# Patient Record
Sex: Female | Born: 1995 | Race: White | Hispanic: No | Marital: Single | Smoking: Current every day smoker
Health system: Southern US, Community
[De-identification: ages and names within clinical notes are randomized; demographics above are authoritative.]

## PROBLEM LIST (undated history)

## (undated) DIAGNOSIS — N200 Calculus of kidney: Secondary | ICD-10-CM

## (undated) DIAGNOSIS — F329 Major depressive disorder, single episode, unspecified: Secondary | ICD-10-CM

## (undated) DIAGNOSIS — E039 Hypothyroidism, unspecified: Principal | ICD-10-CM

## (undated) DIAGNOSIS — U071 COVID-19: Secondary | ICD-10-CM

## (undated) DIAGNOSIS — E559 Vitamin D deficiency, unspecified: Secondary | ICD-10-CM

## (undated) DIAGNOSIS — F32A Depression, unspecified: Secondary | ICD-10-CM

## (undated) HISTORY — DX: Hypothyroidism, unspecified: E03.9

## (undated) HISTORY — DX: Vitamin D deficiency, unspecified: E55.9

## (undated) HISTORY — DX: Calculus of kidney: N20.0

## (undated) HISTORY — DX: COVID-19: U07.1

## (undated) HISTORY — PX: WISDOM TOOTH EXTRACTION: SHX21

---
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
## 2015-01-18 ENCOUNTER — Other Ambulatory Visit: Payer: Self-pay | Admitting: Internal Medicine

## 2015-03-02 ENCOUNTER — Encounter: Payer: Self-pay | Admitting: Internal Medicine

## 2015-03-02 ENCOUNTER — Ambulatory Visit (INDEPENDENT_AMBULATORY_CARE_PROVIDER_SITE_OTHER): Payer: 59 | Admitting: Internal Medicine

## 2015-03-02 VITALS — BP 106/66 | HR 73 | Temp 98.9°F | Ht 61.5 in | Wt 117.2 lb

## 2015-03-02 DIAGNOSIS — Z0000 Encounter for general adult medical examination without abnormal findings: Secondary | ICD-10-CM | POA: Diagnosis not present

## 2015-03-02 DIAGNOSIS — Z23 Encounter for immunization: Secondary | ICD-10-CM

## 2015-03-02 DIAGNOSIS — E039 Hypothyroidism, unspecified: Secondary | ICD-10-CM | POA: Diagnosis not present

## 2015-03-02 MED ORDER — LEVONORGESTREL-ETHINYL ESTRAD 0.1-20 MG-MCG PO TABS: 1.0000 | ORAL_TABLET | Freq: Every day | ORAL | Status: DC

## 2015-03-02 NOTE — Progress Notes (Signed)
Pre visit review using our clinic review tool, if applicable. No additional management support is needed unless otherwise documented below in the visit note. 

## 2015-03-02 NOTE — Progress Notes (Signed)
Subjective:    Patient ID: Corlis Leak, female    DOB: [birth year], 19 y.o.   MRN: [id number]  HPI  19YO female presents for annual exam.  Feeling well. Spotting some in between menses. No missed doses of OCP. Taking 3 months active pill. Now sexually active. No vaginal pain, discharge, pelvic pain. Feels safe where she lives. Completing high school and plans to start community college.    Wt Readings from Last 3 Encounters:  03/02/15 117 lb 4 oz (53.184 kg) (31 %*, Z = -0.48)  11/22/14 107 lb (48.535 kg) (13 %*, Z = -1.14)  11/21/14 107 lb 6.4 oz (48.716 kg) (13 %*, Z = -1.11)   * Growth percentiles are based on CDC 2-20 Years data.   BP Readings from Last 3 Encounters:  03/02/15 106/66  11/22/14 119/80  11/21/14 96/68    Past Medical History  Diagnosis Date  . Hypothyroidism 2011  . Depression   . Hypothyroidism   . Kidney stone    Family History  Problem Relation Age of Onset  . Hypertension Maternal Grandmother   . Thyroid disease Maternal Grandmother   . Depression Maternal Grandmother   . Hypertension Maternal Grandfather   . Cancer Maternal Grandfather     lung  . Stroke Other   . Diabetes Other   . Thyroid disease Paternal Grandmother   . Cancer Paternal Grandfather     prostate  . Depression Mother   . Depression Maternal Aunt    Past Surgical History  Procedure Laterality Date  . Wisdom tooth extraction     Social History   Social History  . Marital Status: Single    Spouse Name: N/A  . Number of Children: N/A  . Years of Education: N/A   Social History Main Topics  . Smoking status: Never Smoker   . Smokeless tobacco: Never Used  . Alcohol Use: No  . Drug Use: No  . Sexual Activity: Yes    Birth Control/ Protection: Pill   Other Topics Concern  . None   Social History Narrative   ** Merged History Encounter **       Lives in [place]. School - [employer]. [employer].      Exercise - active   Diet -healthy     Review of Systems  Constitutional: Negative for fever, chills, appetite change, fatigue and unexpected weight change.  Eyes: Negative for visual disturbance.  Respiratory: Negative for shortness of breath.   Cardiovascular: Negative for chest pain and leg swelling.  Gastrointestinal: Negative for nausea, vomiting, abdominal pain, diarrhea and constipation.  Genitourinary: Negative for dysuria, frequency, flank pain, vaginal bleeding, vaginal discharge, vaginal pain, menstrual problem, pelvic pain and dyspareunia.  Musculoskeletal: Negative for myalgias and arthralgias.  Skin: Negative for color change and rash.  Neurological: Negative for weakness.  Hematological: Negative for adenopathy. Does not bruise/bleed easily.  Psychiatric/Behavioral: Negative for suicidal ideas, sleep disturbance and dysphoric mood. The patient is not nervous/anxious.        Objective:    BP 106/66 mmHg  Pulse 73  Temp(Src) 98.9 F (37.2 C) (Oral)  Ht 5' 1.5" (1.562 m)  Wt 117 lb 4 oz (53.184 kg)  BMI 21.80 kg/m2  SpO2 98%  LMP 12/22/2014 (Approximate) Physical Exam  Constitutional: She is oriented to person, place, and time. She appears well-developed and well-nourished. No distress.  HENT:  Head: Normocephalic and atraumatic.  Right Ear: External ear normal.  Left Ear: External ear normal.  Nose: Nose normal.  Mouth/Throat: Oropharynx is clear and moist. No oropharyngeal exudate.  Eyes: Conjunctivae and EOM are normal. Pupils are equal, round, and reactive to light. Right eye exhibits no discharge. Left eye exhibits no discharge. No scleral icterus.  Neck: Normal range of motion. Neck supple. No tracheal deviation present. No thyromegaly present.  Cardiovascular: Normal rate, regular rhythm, normal heart sounds and intact distal pulses.  Exam reveals no gallop and no friction rub.   No murmur heard. Pulmonary/Chest: Effort normal and breath sounds normal. No respiratory distress. She has no  wheezes. She has no rales. She exhibits no tenderness.  Abdominal: Soft. Bowel sounds are normal. She exhibits no distension and no mass. There is no tenderness. There is no rebound and no guarding.  Musculoskeletal: Normal range of motion. She exhibits no edema or tenderness.  Lymphadenopathy:    She has no cervical adenopathy.  Neurological: She is alert and oriented to person, place, and time. No cranial nerve deficit. She exhibits normal muscle tone. Coordination normal.  Skin: Skin is warm and dry. No rash noted. She is not diaphoretic. No erythema. No pallor.  Psychiatric: She has a normal mood and affect. Her behavior is normal. Judgment and thought content normal.          Assessment & Plan:   Problem List Items Addressed This Visit      Unprioritized   Hypothyroidism    Will check TSH with labs. Continue Levothyroxine.      Relevant Orders   TSH   Routine general medical examination at a health care facility - Primary    General medical exam normal today. Will check labs including urine chlamydia, HIV, CBC, CMP, lipids, TSH. Flu vaccine today. Encouraged healthy diet and exercise. Will change back to monthly OCP, as some spotting with [id number] OCP. Discussed safe sex practices. Encouraged healthy diet and exercises. Follow up in 1 year and prn.      Relevant Orders   GC/chlamydia probe amp, urine   CBC with Differential/Platelet   Lipid panel   Comprehensive metabolic panel   HIV antibody       Return in about 1 year (around 03/01/2016) for Physical.

## 2015-03-02 NOTE — Patient Instructions (Signed)
Health Maintenance, Female Adopting a healthy lifestyle and getting preventive care can go a long way to promote health and wellness. Talk with your health care provider about what schedule of regular examinations is right for you. This is a good chance for you to check in with your provider about disease prevention and staying healthy. In between checkups, there are plenty of things you can do on your own. Experts have done a lot of research about which lifestyle changes and preventive measures are most likely to keep you healthy. Ask your health care provider for more information. WEIGHT AND DIET  Eat a healthy diet  Be sure to include plenty of vegetables, fruits, low-fat dairy products, and lean protein.  Do not eat a lot of foods high in solid fats, added sugars, or salt.  Get regular exercise. This is one of the most important things you can do for your health.  Most adults should exercise for at least 150 minutes each week. The exercise should increase your heart rate and make you sweat (moderate-intensity exercise).  Most adults should also do strengthening exercises at least twice a week. This is in addition to the moderate-intensity exercise.  Maintain a healthy weight  Body mass index (BMI) is a measurement that can be used to identify possible weight problems. It estimates body fat based on height and weight. Your health care provider can help determine your BMI and help you achieve or maintain a healthy weight.  For females 20 years of age and older:   A BMI below 18.5 is considered underweight.  A BMI of 18.5 to 24.9 is normal.  A BMI of 25 to 29.9 is considered overweight.  A BMI of 30 and above is considered obese.  Watch levels of cholesterol and blood lipids  You should start having your blood tested for lipids and cholesterol at 20 years of age, then have this test every 5 years.  You may need to have your cholesterol levels checked more often if:  Your lipid  or cholesterol levels are high.  You are older than 19 years of age.  You are at high risk for heart disease.  CANCER SCREENING   Lung Cancer  Lung cancer screening is recommended for adults 55-80 years old who are at high risk for lung cancer because of a history of smoking.  A yearly low-dose CT scan of the lungs is recommended for people who:  Currently smoke.  Have quit within the past 15 years.  Have at least a 30-pack-year history of smoking. A pack year is smoking an average of one pack of cigarettes a day for 1 year.  Yearly screening should continue until it has been 15 years since you quit.  Yearly screening should stop if you develop a health problem that would prevent you from having lung cancer treatment.  Breast Cancer  Practice breast self-awareness. This means understanding how your breasts normally appear and feel.  It also means doing regular breast self-exams. Let your health care provider know about any changes, no matter how small.  If you are in your 20s or 30s, you should have a clinical breast exam (CBE) by a health care provider every 1-3 years as part of a regular health exam.  If you are 40 or older, have a CBE every year. Also consider having a breast X-ray (mammogram) every year.  If you have a family history of breast cancer, talk to your health care provider about genetic screening.  If you   are at high risk for breast cancer, talk to your health care provider about having an MRI and a mammogram every year.  Breast cancer gene (BRCA) assessment is recommended for women who have family members with BRCA-related cancers. BRCA-related cancers include:  Breast.  Ovarian.  Tubal.  Peritoneal cancers.  Results of the assessment will determine the need for genetic counseling and BRCA1 and BRCA2 testing. Cervical Cancer Your health care provider may recommend that you be screened regularly for cancer of the pelvic organs (ovaries, uterus, and  vagina). This screening involves a pelvic examination, including checking for microscopic changes to the surface of your cervix (Pap test). You may be encouraged to have this screening done every 3 years, beginning at age 21.  For women ages 30-65, health care providers may recommend pelvic exams and Pap testing every 3 years, or they may recommend the Pap and pelvic exam, combined with testing for human papilloma virus (HPV), every 5 years. Some types of HPV increase your risk of cervical cancer. Testing for HPV may also be done on women of any age with unclear Pap test results.  Other health care providers may not recommend any screening for nonpregnant women who are considered low risk for pelvic cancer and who do not have symptoms. Ask your health care provider if a screening pelvic exam is right for you.  If you have had past treatment for cervical cancer or a condition that could lead to cancer, you need Pap tests and screening for cancer for at least 20 years after your treatment. If Pap tests have been discontinued, your risk factors (such as having a new sexual partner) need to be reassessed to determine if screening should resume. Some women have medical problems that increase the chance of getting cervical cancer. In these cases, your health care provider may recommend more frequent screening and Pap tests. Colorectal Cancer  This type of cancer can be detected and often prevented.  Routine colorectal cancer screening usually begins at 19 years of age and continues through 19 years of age.  Your health care provider may recommend screening at an earlier age if you have risk factors for colon cancer.  Your health care provider may also recommend using home test kits to check for hidden blood in the stool.  A small camera at the end of a tube can be used to examine your colon directly (sigmoidoscopy or colonoscopy). This is done to check for the earliest forms of colorectal  cancer.  Routine screening usually begins at age 50.  Direct examination of the colon should be repeated every 5-10 years through 19 years of age. However, you may need to be screened more often if early forms of precancerous polyps or small growths are found. Skin Cancer  Check your skin from head to toe regularly.  Tell your health care provider about any new moles or changes in moles, especially if there is a change in a mole's shape or color.  Also tell your health care provider if you have a mole that is larger than the size of a pencil eraser.  Always use sunscreen. Apply sunscreen liberally and repeatedly throughout the day.  Protect yourself by wearing long sleeves, pants, a wide-brimmed hat, and sunglasses whenever you are outside. HEART DISEASE, DIABETES, AND HIGH BLOOD PRESSURE   High blood pressure causes heart disease and increases the risk of stroke. High blood pressure is more likely to develop in:  People who have blood pressure in the high end   of the normal range (130-139/85-89 mm Hg).  People who are overweight or obese.  People who are African American.  If you are 38-23 years of age, have your blood pressure checked every 3-5 years. If you are 61 years of age or older, have your blood pressure checked every year. You should have your blood pressure measured twice--once when you are at a hospital or clinic, and once when you are not at a hospital or clinic. Record the average of the two measurements. To check your blood pressure when you are not at a hospital or clinic, you can use:  An automated blood pressure machine at a pharmacy.  A home blood pressure monitor.  If you are between 45 years and 39 years old, ask your health care provider if you should take aspirin to prevent strokes.  Have regular diabetes screenings. This involves taking a blood sample to check your fasting blood sugar level.  If you are at a normal weight and have a low risk for diabetes,  have this test once every three years after 19 years of age.  If you are overweight and have a high risk for diabetes, consider being tested at a younger age or more often. PREVENTING INFECTION  Hepatitis B  If you have a higher risk for hepatitis B, you should be screened for this virus. You are considered at high risk for hepatitis B if:  You were born in a country where hepatitis B is common. Ask your health care provider which countries are considered high risk.  Your parents were born in a high-risk country, and you have not been immunized against hepatitis B (hepatitis B vaccine).  You have HIV or AIDS.  You use needles to inject street drugs.  You live with someone who has hepatitis B.  You have had sex with someone who has hepatitis B.  You get hemodialysis treatment.  You take certain medicines for conditions, including cancer, organ transplantation, and autoimmune conditions. Hepatitis C  Blood testing is recommended for:  Everyone born from [birth year] through 1965.  Anyone with known risk factors for hepatitis C. Sexually transmitted infections (STIs)  You should be screened for sexually transmitted infections (STIs) including gonorrhea and chlamydia if:  You are sexually active and are younger than 19 years of age.  You are older than 19 years of age and your health care provider tells you that you are at risk for this type of infection.  Your sexual activity has changed since you were last screened and you are at an increased risk for chlamydia or gonorrhea. Ask your health care provider if you are at risk.  If you do not have HIV, but are at risk, it may be recommended that you take a prescription medicine daily to prevent HIV infection. This is called pre-exposure prophylaxis (PrEP). You are considered at risk if:  You are sexually active and do not regularly use condoms or know the HIV status of your partner(s).  You take drugs by injection.  You are sexually  active with a partner who has HIV. Talk with your health care provider about whether you are at high risk of being infected with HIV. If you choose to begin PrEP, you should first be tested for HIV. You should then be tested every 3 months for as long as you are taking PrEP.  PREGNANCY   If you are premenopausal and you may become pregnant, ask your health care provider about preconception counseling.  If you may  become pregnant, take 400 to 800 micrograms (mcg) of folic acid every day.  If you want to prevent pregnancy, talk to your health care provider about birth control (contraception). OSTEOPOROSIS AND MENOPAUSE   Osteoporosis is a disease in which the bones lose minerals and strength with aging. This can result in serious bone fractures. Your risk for osteoporosis can be identified using a bone density scan.  If you are 61 years of age or older, or if you are at risk for osteoporosis and fractures, ask your health care provider if you should be screened.  Ask your health care provider whether you should take a calcium or vitamin D supplement to lower your risk for osteoporosis.  Menopause may have certain physical symptoms and risks.  Hormone replacement therapy may reduce some of these symptoms and risks. Talk to your health care provider about whether hormone replacement therapy is right for you.  HOME CARE INSTRUCTIONS   Schedule regular health, dental, and eye exams.  Stay current with your immunizations.   Do not use any tobacco products including cigarettes, chewing tobacco, or electronic cigarettes.  If you are pregnant, do not drink alcohol.  If you are breastfeeding, limit how much and how often you drink alcohol.  Limit alcohol intake to no more than 1 drink per day for nonpregnant women. One drink equals 12 ounces of beer, 5 ounces of wine, or 1 ounces of hard liquor.  Do not use street drugs.  Do not share needles.  Ask your health care provider for help if  you need support or information about quitting drugs.  Tell your health care provider if you often feel depressed.  Tell your health care provider if you have ever been abused or do not feel safe at home.   This information is not intended to replace advice given to you by your health care provider. Make sure you discuss any questions you have with your health care provider.   Document Released: 10/25/2010 Document Revised: 05/02/2014 Document Reviewed: 03/13/2013 Elsevier Interactive Patient Education [employer].

## 2015-03-02 NOTE — Assessment & Plan Note (Signed)
General medical exam normal today. Will check labs including urine chlamydia, HIV, CBC, CMP, lipids, TSH. Flu vaccine today. Encouraged healthy diet and exercise. Will change back to monthly OCP, as some spotting with [id number] OCP. Discussed safe sex practices. Encouraged healthy diet and exercises. Follow up in 1 year and prn.

## 2015-03-02 NOTE — Assessment & Plan Note (Signed)
Will check TSH with labs. Continue Levothyroxine. 

## 2015-03-03 ENCOUNTER — Telehealth: Payer: Self-pay | Admitting: Internal Medicine

## 2015-03-03 ENCOUNTER — Other Ambulatory Visit: Payer: Self-pay | Admitting: *Deleted

## 2015-03-03 LAB — LIPID PANEL
CHOL/HDL RATIO: 3
Cholesterol: 163 mg/dL (ref 0–200)
HDL: 49.7 mg/dL (ref >39.00)
LDL CALC: 93 mg/dL (ref 0–99)
NonHDL: 113.25
TRIGLYCERIDES: 102 mg/dL (ref 0.0–149.0)
VLDL: 20.4 mg/dL (ref 0.0–40.0)

## 2015-03-03 LAB — COMPREHENSIVE METABOLIC PANEL
ALBUMIN: 4.2 g/dL (ref 3.5–5.2)
ALT: 22 U/L (ref 0–35)
AST: 23 U/L (ref 0–37)
Alkaline Phosphatase: 50 U/L (ref 47–119)
BUN: 11 mg/dL (ref 6–23)
CHLORIDE: 104 meq/L (ref 96–112)
CO2: 24 meq/L (ref 19–32)
Calcium: 9.3 mg/dL (ref 8.4–10.5)
Creatinine, Ser: 0.56 mg/dL (ref 0.40–1.20)
GFR: 148.29 mL/min (ref >60.00)
Glucose, Bld: 36 mg/dL — CL (ref 70–99)
POTASSIUM: 4.1 meq/L (ref 3.5–5.1)
Sodium: 140 mEq/L (ref 135–145)
Total Bilirubin: 0.6 mg/dL (ref 0.3–1.2)
Total Protein: 6.7 g/dL (ref 6.0–8.3)

## 2015-03-03 LAB — CBC WITH DIFFERENTIAL/PLATELET
BASOS PCT: 0.6 % (ref 0.0–3.0)
Basophils Absolute: 0 10*3/uL (ref 0.0–0.1)
EOS ABS: 0.1 10*3/uL (ref 0.0–0.7)
Eosinophils Relative: 1.6 % (ref 0.0–5.0)
HEMATOCRIT: 39.3 % (ref 36.0–49.0)
HEMOGLOBIN: 13.2 g/dL (ref 12.0–16.0)
Lymphocytes Relative: 23.1 % — ABNORMAL LOW (ref 24.0–48.0)
Lymphs Abs: 1.9 10*3/uL (ref 0.7–4.0)
MCHC: 33.7 g/dL (ref 31.0–37.0)
MCV: 92.3 fl (ref 78.0–98.0)
Monocytes Absolute: 0.5 10*3/uL (ref 0.1–1.0)
Monocytes Relative: 6.5 % (ref 3.0–12.0)
Neutro Abs: 5.6 10*3/uL (ref 1.4–7.7)
Neutrophils Relative %: 68.2 % (ref 43.0–71.0)
Platelets: 289 10*3/uL (ref 150.0–575.0)
RBC: 4.26 Mil/uL (ref 3.80–5.70)
RDW: 13.4 % (ref 11.4–15.5)
WBC: 8.2 10*3/uL (ref 4.5–13.5)

## 2015-03-03 LAB — TSH: TSH: 6 u[IU]/mL — ABNORMAL HIGH (ref 0.40–5.00)

## 2015-03-03 LAB — HIV ANTIBODY (ROUTINE TESTING W REFLEX): HIV 1&2 Ab, 4th Generation: NONREACTIVE

## 2015-03-03 LAB — GC/CHLAMYDIA PROBE AMP, URINE
Chlamydia, Swab/Urine, PCR: NEGATIVE
GC PROBE AMP, URINE: NEGATIVE

## 2015-03-03 MED ORDER — LEVOTHYROXINE SODIUM 112 MCG PO TABS: 112.0000 ug | ORAL_TABLET | Freq: Every day | ORAL | Status: DC

## 2015-03-03 NOTE — Telephone Encounter (Signed)
This lab was from yesterday. Glucose likely low because blood sat for so long before processing.

## 2015-03-03 NOTE — Telephone Encounter (Signed)
CRITICAL LAB:  GLUCOSE: 36  Per the [facility].   Please advise?

## 2015-04-14 ENCOUNTER — Other Ambulatory Visit: Payer: Self-pay

## 2015-05-11 ENCOUNTER — Other Ambulatory Visit: Payer: Self-pay | Admitting: Internal Medicine

## 2015-05-25 ENCOUNTER — Other Ambulatory Visit: Payer: Self-pay

## 2015-05-25 MED ORDER — LEVOTHYROXINE SODIUM 112 MCG PO TABS: 112.0000 ug | ORAL_TABLET | Freq: Every day | ORAL | Status: DC

## 2015-05-28 ENCOUNTER — Other Ambulatory Visit: Payer: Self-pay | Admitting: Internal Medicine

## 2015-07-07 ENCOUNTER — Other Ambulatory Visit: Payer: Self-pay | Admitting: *Deleted

## 2015-07-07 MED ORDER — LEVOTHYROXINE SODIUM 112 MCG PO TABS: 112.0000 ug | ORAL_TABLET | Freq: Every day | ORAL | Status: DC

## 2015-07-07 NOTE — Telephone Encounter (Signed)
Fine to fill x 1 month. Needs repeat TSH prior to additional refills

## 2015-07-07 NOTE — Telephone Encounter (Signed)
Patient requested a medication refill for levothyroxine . Pharmacy [facility] in [place]

## 2015-07-07 NOTE — Telephone Encounter (Signed)
FYI: Pt scheduled for labs 07/30/15

## 2015-07-07 NOTE — Telephone Encounter (Signed)
Pt is requesting a refill on levothyroxine (SYNTHROID, LEVOTHROID) 112 MCG tablet. Pt last OV was 11/16. Pt did not come in for 6 week labs for tsh. Please advise, thanks

## 2015-07-30 ENCOUNTER — Other Ambulatory Visit (INDEPENDENT_AMBULATORY_CARE_PROVIDER_SITE_OTHER): Payer: 59

## 2015-07-30 ENCOUNTER — Telehealth: Payer: Self-pay | Admitting: *Deleted

## 2015-07-30 DIAGNOSIS — E038 Other specified hypothyroidism: Secondary | ICD-10-CM | POA: Diagnosis not present

## 2015-07-30 NOTE — Telephone Encounter (Signed)
Labs and dx?  

## 2015-07-30 NOTE — Telephone Encounter (Signed)
I assume this was for a TSH repeat? For hypothyroidism

## 2015-07-31 LAB — TSH: TSH: 4.02 u[IU]/mL (ref 0.40–5.00)

## 2015-12-23 IMAGING — CT CT ABD-PELV W/O CM
1 of 2 series · 5 of 32 positions shown, 10 images · non-contrast
Comparison: None.

CLINICAL DATA: Left flank pain, history of kidney stones, some red
blood cells in the urine

EXAM:
CT ABDOMEN AND PELVIS WITHOUT CONTRAST
TECHNIQUE: Multidetector CT imaging of the abdomen and pelvis was performed
following the standard protocol without IV contrast.

[Series 4: lung windows · axial · 0.59mm/px · z∈[-644,-578]mm · 5 of 21 slices shown, 10 images]
[im 4/21  soft-tissue]
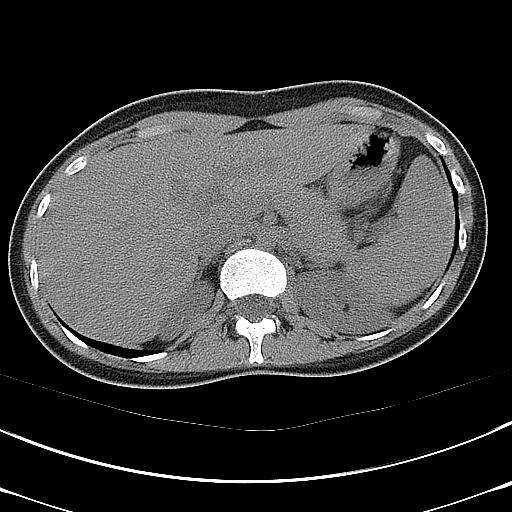
[im 4/21  bone]
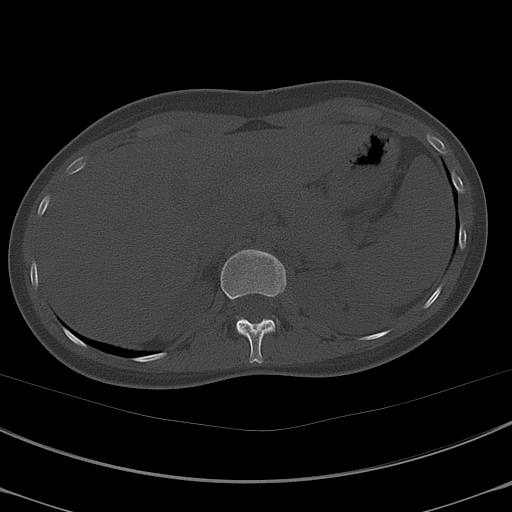
[im 7/21  soft-tissue]
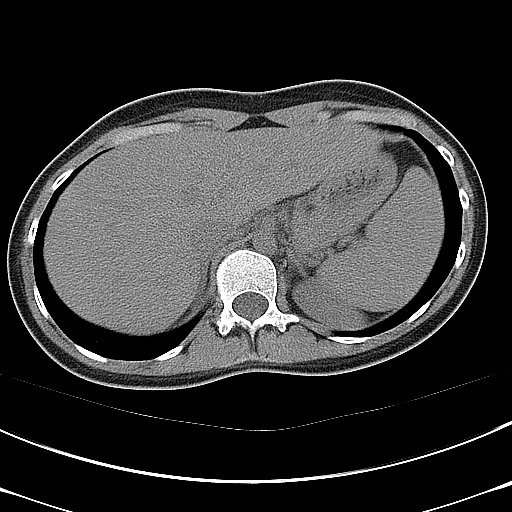
[im 7/21  lung]
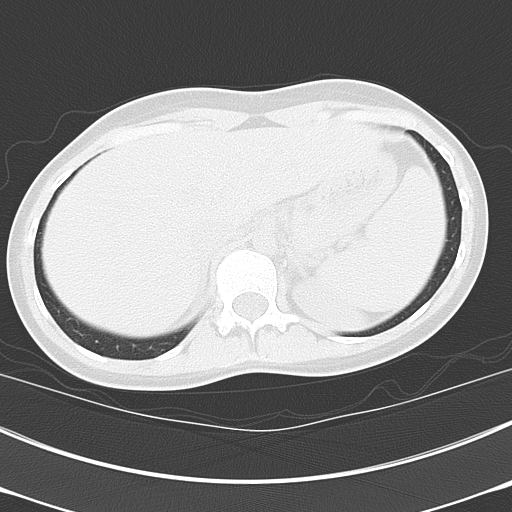
[im 11/21  soft-tissue]
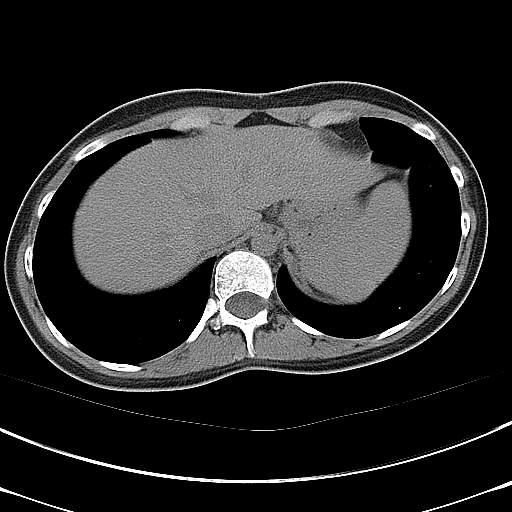
[im 11/21  lung]
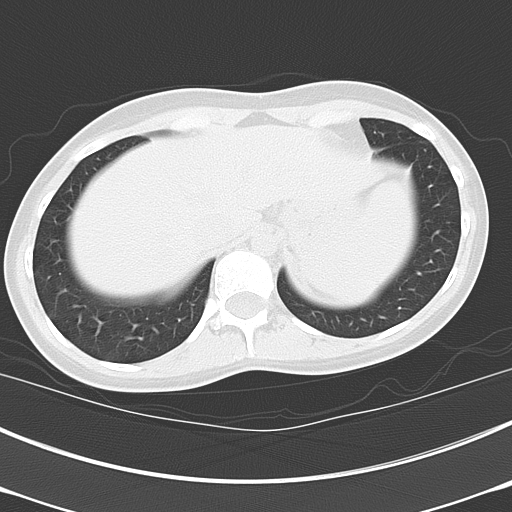
[im 14/21  soft-tissue]
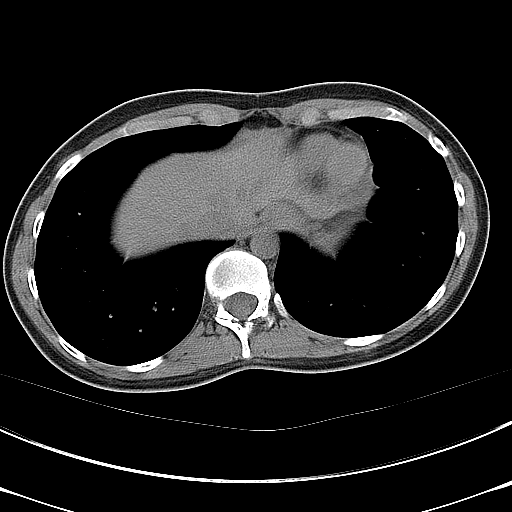
[im 14/21  lung]
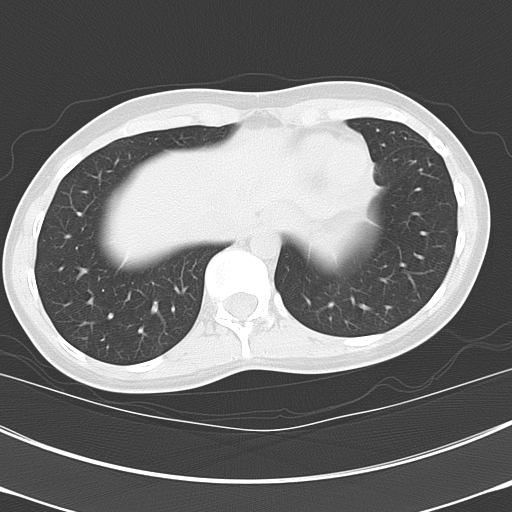
[im 17/21  soft-tissue]
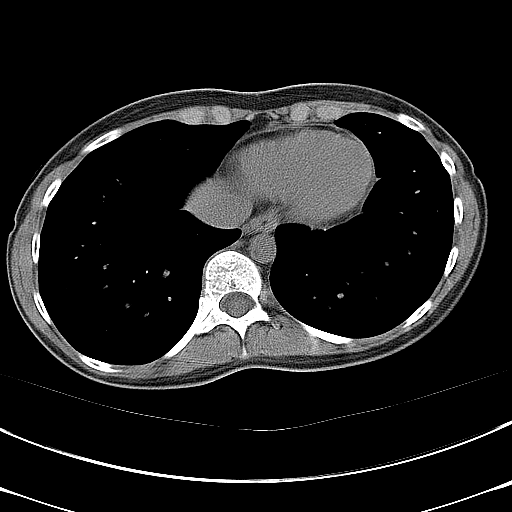
[im 17/21  lung]
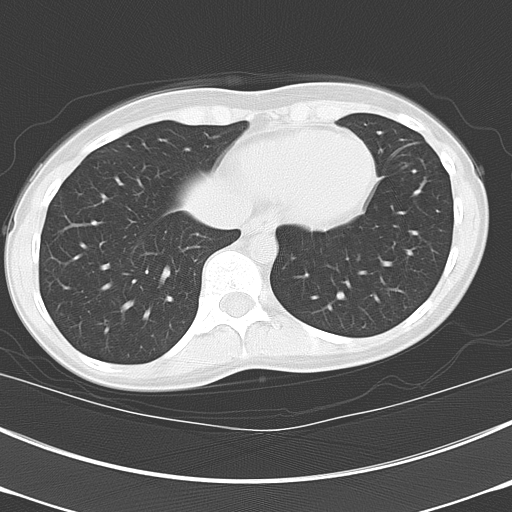

[5 of 32 positions shown; findings below may reference images not displayed]

FINDINGS: The lung bases are clear. The liver is unremarkable in the
unenhanced state. No calcified gallstones are seen. The pancreas is
difficult to visualize on this unenhanced study, and some prominence
of the tail of the pancreas cannot be excluded. Ultrasound may be
helpful if any further assessment is warranted. This appearance of
the tail of the pancreas may simply be due to adjacent non-opacified
bowel loops. There are bilateral renal calculi present. The largest
calculus is within the left lower collecting system measuring 6 x 8
mm. Smaller calculi are present bilaterally. The proximal ureters do
not appear to be dilated although there is slight fullness of the
left pelvis of questionable significance, possibly due to mild UPJ
narrowing. The abdominal aorta is normal in caliber.

The distal ureters are difficult to visualize on this unenhanced
study but no dilatation or calculus is seen. There are
low-attenuation structures in both adnexa consistent with small
follicles and the uterus is normal in size. There is a small amount
of free fluid noted within the pelvis which appears physiologic. The
colon is largely decompressed. The lumbar vertebrae are in normal
alignment.
IMPRESSION: 1. Bilateral nonobstructing renal calculi. No present
hydronephrosis. No ureteral calculi are noted.
2. Slight prominence of the tail of the pancreas most likely due to
adjacent non-opacified bowel loops in this young patient. Ultrasound
may be helpful to assess further if warranted clinically.
3. Small amount of fluid in the pelvis most likely physiologic with
probable follicles bilaterally.
4. Slight prominence of the left renal pelvis probably indicates
mild UPJ narrowing.

## 2016-01-22 ENCOUNTER — Other Ambulatory Visit: Payer: Self-pay

## 2016-01-22 MED ORDER — LEVONORGESTREL-ETHINYL ESTRAD 0.1-20 MG-MCG PO TABS: 1.0000 | ORAL_TABLET | Freq: Every day | ORAL | 1 refills | Status: DC

## 2016-01-22 NOTE — Telephone Encounter (Signed)
Medication has been refilled for two months. Patient needs to be evaluated by new PCP.

## 2016-02-12 ENCOUNTER — Other Ambulatory Visit: Payer: Self-pay

## 2016-02-12 MED ORDER — LEVONORGESTREL-ETHINYL ESTRAD 0.1-20 MG-MCG PO TABS: 1.0000 | ORAL_TABLET | Freq: Every day | ORAL | 1 refills | Status: DC

## 2016-02-12 NOTE — Telephone Encounter (Signed)
Medication has been refilled. But patient needs to keep appointment on [id number]

## 2016-02-29 ENCOUNTER — Telehealth: Payer: Self-pay | Admitting: *Deleted

## 2016-03-01 ENCOUNTER — Telehealth: Payer: Self-pay | Admitting: Family

## 2016-03-01 NOTE — Telephone Encounter (Signed)
Please call pt Last ov 02/2015,  Needs appt for refill of OCP, Aviane.

## 2016-03-01 NOTE — Telephone Encounter (Signed)
Left message with father for patient to return call back.

## 2016-03-02 ENCOUNTER — Encounter: Payer: Self-pay | Admitting: Internal Medicine

## 2016-03-03 ENCOUNTER — Ambulatory Visit (INDEPENDENT_AMBULATORY_CARE_PROVIDER_SITE_OTHER): Payer: 59 | Admitting: Family

## 2016-03-03 ENCOUNTER — Encounter: Payer: Self-pay | Admitting: Family

## 2016-03-03 VITALS — BP 108/70 | HR 69 | Temp 98.3°F | Ht 61.25 in | Wt 138.6 lb

## 2016-03-03 DIAGNOSIS — F3341 Major depressive disorder, recurrent, in partial remission: Secondary | ICD-10-CM

## 2016-03-03 DIAGNOSIS — E039 Hypothyroidism, unspecified: Secondary | ICD-10-CM | POA: Diagnosis not present

## 2016-03-03 DIAGNOSIS — F418 Other specified anxiety disorders: Secondary | ICD-10-CM

## 2016-03-03 DIAGNOSIS — Z0001 Encounter for general adult medical examination with abnormal findings: Secondary | ICD-10-CM | POA: Diagnosis not present

## 2016-03-03 DIAGNOSIS — Z0000 Encounter for general adult medical examination without abnormal findings: Secondary | ICD-10-CM

## 2016-03-03 LAB — CBC WITH DIFFERENTIAL/PLATELET
BASOS ABS: 0 10*3/uL (ref 0.0–0.1)
Basophils Relative: 0.2 % (ref 0.0–3.0)
Eosinophils Absolute: 0.1 10*3/uL (ref 0.0–0.7)
Eosinophils Relative: 1.6 % (ref 0.0–5.0)
HCT: 43.3 % (ref 36.0–49.0)
HEMOGLOBIN: 14.8 g/dL (ref 12.0–16.0)
Lymphocytes Relative: 27.3 % (ref 24.0–48.0)
Lymphs Abs: 2.3 10*3/uL (ref 0.7–4.0)
MCHC: 34.2 g/dL (ref 31.0–37.0)
MCV: 88.6 fl (ref 78.0–98.0)
MONOS PCT: 6.8 % (ref 3.0–12.0)
Monocytes Absolute: 0.6 10*3/uL (ref 0.1–1.0)
NEUTROS ABS: 5.4 10*3/uL (ref 1.4–7.7)
Neutrophils Relative %: 64.1 % (ref 43.0–71.0)
PLATELETS: 273 10*3/uL (ref 150.0–575.0)
RBC: 4.88 Mil/uL (ref 3.80–5.70)
RDW: 13.4 % (ref 11.4–15.5)
WBC: 8.4 10*3/uL (ref 4.5–13.5)

## 2016-03-03 LAB — COMPREHENSIVE METABOLIC PANEL
ALBUMIN: 4.6 g/dL (ref 3.5–5.2)
ALT: 35 U/L (ref 0–35)
AST: 23 U/L (ref 0–37)
Alkaline Phosphatase: 105 U/L (ref 47–119)
BILIRUBIN TOTAL: 0.6 mg/dL (ref 0.2–1.2)
BUN: 15 mg/dL (ref 6–23)
CO2: 29 meq/L (ref 19–32)
Calcium: 9.8 mg/dL (ref 8.4–10.5)
Chloride: 104 mEq/L (ref 96–112)
Creatinine, Ser: 0.57 mg/dL (ref 0.40–1.20)
GFR: 143.79 mL/min (ref >60.00)
Glucose, Bld: 91 mg/dL (ref 70–99)
Potassium: 4.2 mEq/L (ref 3.5–5.1)
Sodium: 138 mEq/L (ref 135–145)
Total Protein: 7.3 g/dL (ref 6.0–8.3)

## 2016-03-03 LAB — LIPID PANEL
CHOL/HDL RATIO: 4
Cholesterol: 227 mg/dL — ABNORMAL HIGH (ref 0–200)
HDL: 61 mg/dL (ref >39.00)
LDL Cholesterol: 128 mg/dL — ABNORMAL HIGH (ref 0–99)
NONHDL: 165.74
Triglycerides: 187 mg/dL — ABNORMAL HIGH (ref 0.0–149.0)
VLDL: 37.4 mg/dL (ref 0.0–40.0)

## 2016-03-03 LAB — VITAMIN D 25 HYDROXY (VIT D DEFICIENCY, FRACTURES): VITD: 23.89 ng/mL — ABNORMAL LOW (ref 30.00–100.00)

## 2016-03-03 LAB — HEMOGLOBIN A1C: Hgb A1c MFr Bld: 5.1 % (ref 4.6–6.5)

## 2016-03-03 LAB — POCT URINE PREGNANCY: PREG TEST UR: NEGATIVE

## 2016-03-03 LAB — TSH: TSH: 13.07 u[IU]/mL — AB (ref 0.40–5.00)

## 2016-03-03 MED ORDER — ESCITALOPRAM OXALATE 10 MG PO TABS: ORAL_TABLET | ORAL | 1 refills | Status: DC

## 2016-03-03 MED ORDER — LEVONORGESTREL-ETHINYL ESTRAD 0.1-20 MG-MCG PO TABS: 1.0000 | ORAL_TABLET | Freq: Every day | ORAL | 11 refills | Status: DC

## 2016-03-03 MED ORDER — LEVOTHYROXINE SODIUM 112 MCG PO TABS: 112.0000 ug | ORAL_TABLET | Freq: Every day | ORAL | 0 refills | Status: DC

## 2016-03-03 MED ORDER — LEVONORGESTREL-ETHINYL ESTRAD 0.1-20 MG-MCG PO TABS: 1.0000 | ORAL_TABLET | Freq: Every day | ORAL | 1 refills | Status: DC

## 2016-03-03 NOTE — Progress Notes (Signed)
Subjective:    Patient ID: Patricia Ruiz, female    DOB: 05-27-1995, 20 y.o.   MRN: [id number]  CC: Patricia LeakCatherine R Shvartsman is a 20 y.o. female who presents today for Patricia Ruiz. HPI: Patient presents for physical.   Hypothyroidism- Notes cold intolerance. No palpitations. OCP- Has been on OCP and doesn't remember every day. Would like mirena. No concerns for STD. Sexually active. Anxiety- Worsening due to work and school stress. No panic attacks. has been on lexapro for the past 2 years. Had done therapy in the past. Anxiety lead to depression. No thoughts of hurting herself  No alcohol or smoking.     HISTORY:  Past Medical History:  Diagnosis Date  . Depression   . Hypothyroidism 2011  . Hypothyroidism   . Kidney stone    Past Surgical History:  Procedure Laterality Date  . WISDOM TOOTH EXTRACTION     Family History  Problem Relation Age of Onset  . Depression Mother   . Hypertension Maternal Grandmother   . Thyroid disease Maternal Grandmother   . Depression Maternal Grandmother   . Hypertension Maternal Grandfather   . Cancer Maternal Grandfather     lung  . Stroke Other   . Diabetes Other   . Thyroid disease Paternal Grandmother   . Cancer Paternal Grandfather     prostate  . Depression Maternal Aunt     Allergies: Augmentin [amoxicillin-pot clavulanate] and Augmentin [amoxicillin-pot clavulanate] No current outpatient prescriptions on file prior to visit.   No current facility-administered medications on file prior to visit.     Social History  Substance Use Topics  . Smoking status: Never Smoker  . Smokeless tobacco: Never Used  . Alcohol use No    Review of Systems  Constitutional: Negative for chills, fever and unexpected weight change.  HENT: Negative for congestion.   Respiratory: Negative for cough.   Cardiovascular: Negative for chest pain, palpitations and leg swelling.  Gastrointestinal: Negative for nausea and vomiting.  Endocrine: Positive  for cold intolerance.  Musculoskeletal: Negative for arthralgias and myalgias.  Skin: Negative for rash.  Neurological: Negative for headaches.  Hematological: Negative for adenopathy.  Psychiatric/Behavioral: Negative for confusion and suicidal ideas. The patient is nervous/anxious.       Objective:    BP 108/70 (BP Location: Left Arm, Patient Position: Sitting, Cuff Size: Normal)   Pulse 69   Temp 98.3 F (36.8 C) (Oral)   Ht 5' 1.25" (1.556 m)   Wt 138 lb 9.6 oz (62.9 kg)   LMP 02/19/2016   SpO2 99%   BMI 25.97 kg/m  BP Readings from Last 3 Encounters:  03/03/16 108/70  03/02/15 106/66  11/22/14 119/80   Wt Readings from Last 3 Encounters:  03/03/16 138 lb 9.6 oz (62.9 kg) (67 %, Z= 0.44)*  03/02/15 117 lb 4 oz (53.2 kg) (31 %, Z= -0.48)*  11/22/14 107 lb (48.5 kg) (13 %, Z= -1.14)*   * Growth percentiles are based on CDC 2-20 Years data.    Physical Exam  Constitutional: She appears well-developed and well-nourished.  Eyes: Conjunctivae are normal.  Neck: No thyroid mass and no thyromegaly present.  Cardiovascular: Normal rate, regular rhythm, normal heart sounds and normal pulses.   Pulmonary/Chest: Effort normal and breath sounds normal. She has no wheezes. She has no rhonchi. She has no rales. Right breast exhibits no inverted nipple, no mass, no nipple discharge, no skin change and no tenderness. Left breast exhibits no inverted nipple, no mass, no nipple  discharge, no skin change and no tenderness. Breasts are symmetrical.  CBE performed.   Lymphadenopathy:       Head (right side): No submental, no submandibular, no tonsillar, no preauricular, no posterior auricular and no occipital adenopathy present.       Head (left side): No submental, no submandibular, no tonsillar, no preauricular, no posterior auricular and no occipital adenopathy present.    She has no cervical adenopathy.       Right cervical: No superficial cervical, no deep cervical and no posterior  cervical adenopathy present.      Left cervical: No superficial cervical, no deep cervical and no posterior cervical adenopathy present.    She has no axillary adenopathy.  Neurological: She is alert.  Skin: Skin is warm and dry.  Psychiatric: She has a normal mood and affect. Her speech is normal and behavior is normal. Thought content normal.  Vitals reviewed.      Assessment & Plan:   Problem List Items Addressed This Visit      Endocrine   Hypothyroidism    Having symptoms. Pending TSH.       Relevant Medications   levothyroxine (SYNTHROID, LEVOTHROID) 112 MCG tablet     Other   Routine general medical examination at a health care facility - Primary    CBE performed. Pending screening labs and urine pregnancy to restart OCP. Counseling regarding safe sex practices, healthy diet and exercise      Relevant Medications   levonorgestrel-ethinyl estradiol (AVIANE) 0.1-20 MG-MCG tablet   Other Relevant Orders   CBC with Differential/Platelet   Comprehensive metabolic panel   Hemoglobin A1c   TSH   VITAMIN D 25 Hydroxy (Vit-D Deficiency, Fractures)   Lipid panel   POCT urine pregnancy   Ambulatory referral to Obstetrics / Gynecology   Anxiety and depression    Worsening anxiety. Increase lexapro. F/u 6-8 weeks.       Relevant Medications   escitalopram (LEXAPRO) 10 MG tablet       I have changed Ms. Patricia Ruiz's escitalopram. I am also having her maintain her levothyroxine and levonorgestrel-ethinyl estradiol.   Meds ordered this encounter  Medications  . escitalopram (LEXAPRO) 10 MG tablet    Sig: TAKE 1 TABLET (5 MG TOTAL) BY MOUTH DAILY.    Dispense:  90 tablet    Refill:  1    Order Specific Question:   Supervising Provider    Answer:   Duncan Dull L [[id number]]  . DISCONTD: levonorgestrel-ethinyl estradiol (AVIANE) 0.1-20 MG-MCG tablet    Sig: Take 1 tablet by mouth daily.    Dispense:  1 Package    Refill:  1    Must see PCP before further refills.     Order Specific Question:   Supervising Provider    Answer:   Sherlene Shams [[id number]]  . levothyroxine (SYNTHROID, LEVOTHROID) 112 MCG tablet    Sig: Take 1 tablet (112 mcg total) by mouth daily before breakfast.    Dispense:  30 tablet    Refill:  0    Order Specific Question:   Supervising Provider    Answer:   Duncan Dull L [[id number]]  . levonorgestrel-ethinyl estradiol (AVIANE) 0.1-20 MG-MCG tablet    Sig: Take 1 tablet by mouth daily.    Dispense:  1 Package    Refill:  11    Must see PCP before further refills.    Order Specific Question:   Supervising Provider    Answer:   Sherlene Shams [  [id number]]    Return precautions given.   Risks, benefits, and alternatives of the medications and treatment plan prescribed today were discussed, and patient expressed understanding.   Education regarding symptom management and diagnosis given to patient on AVS.  Continue to follow with Rennie PlowmanMargaret Naoki Migliaccio, FNP for routine health maintenance.   Patricia Leakatherine R Ruiz and I agreed with plan.   Rennie PlowmanMargaret Charika Mikelson, FNP

## 2016-03-03 NOTE — Assessment & Plan Note (Signed)
Having symptoms. Pending TSH.

## 2016-03-03 NOTE — Progress Notes (Signed)
Pre visit review using our clinic review tool, if applicable. No additional management support is needed unless otherwise documented below in the visit note. 

## 2016-03-03 NOTE — Patient Instructions (Signed)
Pleasure meeting you  Follow up 6-8 weeks to see how medication is doing for you.   Labs   Health Maintenance, Female Adopting a healthy lifestyle and getting preventive care can go a long way to promote health and wellness. Talk with your health care provider about what schedule of regular examinations is right for you. This is a good chance for you to check in with your provider about disease prevention and staying healthy. In between checkups, there are plenty of things you can do on your own. Experts have done a lot of research about which lifestyle changes and preventive measures are most likely to keep you healthy. Ask your health care provider for more information. WEIGHT AND DIET  Eat a healthy diet  Be sure to include plenty of vegetables, fruits, low-fat dairy products, and lean protein.  Do not eat a lot of foods high in solid fats, added sugars, or salt.  Get regular exercise. This is one of the most important things you can do for your health.  Most adults should exercise for at least 150 minutes each week. The exercise should increase your heart rate and make you sweat (moderate-intensity exercise).  Most adults should also do strengthening exercises at least twice a week. This is in addition to the moderate-intensity exercise.  Maintain a healthy weight  Body mass index (BMI) is a measurement that can be used to identify possible weight problems. It estimates body fat based on height and weight. Your health care provider can help determine your BMI and help you achieve or maintain a healthy weight.  For females 66 years of age and older:   A BMI below 18.5 is considered underweight.  A BMI of 18.5 to 24.9 is normal.  A BMI of 25 to 29.9 is considered overweight.  A BMI of 30 and above is considered obese.  Watch levels of cholesterol and blood lipids  You should start having your blood tested for lipids and cholesterol at 20 years of age, then have this test  every 5 years.  You may need to have your cholesterol levels checked more often if:  Your lipid or cholesterol levels are high.  You are older than 20 years of age.  You are at high risk for heart disease.  CANCER SCREENING   Lung Cancer  Lung cancer screening is recommended for adults 88-24 years old who are at high risk for lung cancer because of a history of smoking.  A yearly low-dose CT scan of the lungs is recommended for people who:  Currently smoke.  Have quit within the past 15 years.  Have at least a 30-pack-year history of smoking. A pack year is smoking an average of one pack of cigarettes a day for 1 year.  Yearly screening should continue until it has been 15 years since you quit.  Yearly screening should stop if you develop a health problem that would prevent you from having lung cancer treatment.  Breast Cancer  Practice breast self-awareness. This means understanding how your breasts normally appear and feel.  It also means doing regular breast self-exams. Let your health care provider know about any changes, no matter how small.  If you are in your 20s or 30s, you should have a clinical breast exam (CBE) by a health care provider every 1-3 years as part of a regular health exam.  If you are 10 or older, have a CBE every year. Also consider having a breast X-ray (mammogram) every year.  If you have a family history of breast cancer, talk to your health care provider about genetic screening.  If you are at high risk for breast cancer, talk to your health care provider about having an MRI and a mammogram every year.  Breast cancer gene (BRCA) assessment is recommended for women who have family members with BRCA-related cancers. BRCA-related cancers include:  Breast.  Ovarian.  Tubal.  Peritoneal cancers.  Results of the assessment will determine the need for genetic counseling and BRCA1 and BRCA2 testing. Cervical Cancer Your health care provider  may recommend that you be screened regularly for cancer of the pelvic organs (ovaries, uterus, and vagina). This screening involves a pelvic examination, including checking for microscopic changes to the surface of your cervix (Pap test). You may be encouraged to have this screening done every 3 years, beginning at age 43.  For women ages 59-65, health care providers may recommend pelvic exams and Pap testing every 3 years, or they may recommend the Pap and pelvic exam, combined with testing for human papilloma virus (HPV), every 5 years. Some types of HPV increase your risk of cervical cancer. Testing for HPV may also be done on women of any age with unclear Pap test results.  Other health care providers may not recommend any screening for nonpregnant women who are considered low risk for pelvic cancer and who do not have symptoms. Ask your health care provider if a screening pelvic exam is right for you.  If you have had past treatment for cervical cancer or a condition that could lead to cancer, you need Pap tests and screening for cancer for at least 20 years after your treatment. If Pap tests have been discontinued, your risk factors (such as having a new sexual partner) need to be reassessed to determine if screening should resume. Some women have medical problems that increase the chance of getting cervical cancer. In these cases, your health care provider may recommend more frequent screening and Pap tests. Colorectal Cancer  This type of cancer can be detected and often prevented.  Routine colorectal cancer screening usually begins at 20 years of age and continues through 20 years of age.  Your health care provider may recommend screening at an earlier age if you have risk factors for colon cancer.  Your health care provider may also recommend using home test kits to check for hidden blood in the stool.  A small camera at the end of a tube can be used to examine your colon directly  (sigmoidoscopy or colonoscopy). This is done to check for the earliest forms of colorectal cancer.  Routine screening usually begins at age 55.  Direct examination of the colon should be repeated every 5-10 years through 19 years of age. However, you may need to be screened more often if early forms of precancerous polyps or small growths are found. Skin Cancer  Check your skin from head to toe regularly.  Tell your health care provider about any new moles or changes in moles, especially if there is a change in a mole's shape or color.  Also tell your health care provider if you have a mole that is larger than the size of a pencil eraser.  Always use sunscreen. Apply sunscreen liberally and repeatedly throughout the day.  Protect yourself by wearing long sleeves, pants, a wide-brimmed hat, and sunglasses whenever you are outside. HEART DISEASE, DIABETES, AND HIGH BLOOD PRESSURE   High blood pressure causes heart disease and increases the risk  of stroke. High blood pressure is more likely to develop in:  People who have blood pressure in the high end of the normal range (130-139/85-89 mm Hg).  People who are overweight or obese.  People who are African American.  If you are 77-22 years of age, have your blood pressure checked every 3-5 years. If you are 9 years of age or older, have your blood pressure checked every year. You should have your blood pressure measured twice--once when you are at a hospital or clinic, and once when you are not at a hospital or clinic. Record the average of the two measurements. To check your blood pressure when you are not at a hospital or clinic, you can use:  An automated blood pressure machine at a pharmacy.  A home blood pressure monitor.  If you are between 44 years and 53 years old, ask your health care provider if you should take aspirin to prevent strokes.  Have regular diabetes screenings. This involves taking a blood sample to check your  fasting blood sugar level.  If you are at a normal weight and have a low risk for diabetes, have this test once every three years after 20 years of age.  If you are overweight and have a high risk for diabetes, consider being tested at a younger age or more often. PREVENTING INFECTION  Hepatitis B  If you have a higher risk for hepatitis B, you should be screened for this virus. You are considered at high risk for hepatitis B if:  You were born in a country where hepatitis B is common. Ask your health care provider which countries are considered high risk.  Your parents were born in a high-risk country, and you have not been immunized against hepatitis B (hepatitis B vaccine).  You have HIV or AIDS.  You use needles to inject street drugs.  You live with someone who has hepatitis B.  You have had sex with someone who has hepatitis B.  You get hemodialysis treatment.  You take certain medicines for conditions, including cancer, organ transplantation, and autoimmune conditions. Hepatitis C  Blood testing is recommended for:  Everyone born from [birth year] through 1965.  Anyone with known risk factors for hepatitis C. Sexually transmitted infections (STIs)  You should be screened for sexually transmitted infections (STIs) including gonorrhea and chlamydia if:  You are sexually active and are younger than 20 years of age.  You are older than 20 years of age and your health care provider tells you that you are at risk for this type of infection.  Your sexual activity has changed since you were last screened and you are at an increased risk for chlamydia or gonorrhea. Ask your health care provider if you are at risk.  If you do not have HIV, but are at risk, it may be recommended that you take a prescription medicine daily to prevent HIV infection. This is called pre-exposure prophylaxis (PrEP). You are considered at risk if:  You are sexually active and do not regularly use condoms or  know the HIV status of your partner(s).  You take drugs by injection.  You are sexually active with a partner who has HIV. Talk with your health care provider about whether you are at high risk of being infected with HIV. If you choose to begin PrEP, you should first be tested for HIV. You should then be tested every 3 months for as long as you are taking PrEP.  PREGNANCY  If you are premenopausal and you may become pregnant, ask your health care provider about preconception counseling.  If you may become pregnant, take 400 to 800 micrograms (mcg) of folic acid every day.  If you want to prevent pregnancy, talk to your health care provider about birth control (contraception). OSTEOPOROSIS AND MENOPAUSE   Osteoporosis is a disease in which the bones lose minerals and strength with aging. This can result in serious bone fractures. Your risk for osteoporosis can be identified using a bone density scan.  If you are 36 years of age or older, or if you are at risk for osteoporosis and fractures, ask your health care provider if you should be screened.  Ask your health care provider whether you should take a calcium or vitamin D supplement to lower your risk for osteoporosis.  Menopause may have certain physical symptoms and risks.  Hormone replacement therapy may reduce some of these symptoms and risks. Talk to your health care provider about whether hormone replacement therapy is right for you.  HOME CARE INSTRUCTIONS   Schedule regular health, dental, and eye exams.  Stay current with your immunizations.   Do not use any tobacco products including cigarettes, chewing tobacco, or electronic cigarettes.  If you are pregnant, do not drink alcohol.  If you are breastfeeding, limit how much and how often you drink alcohol.  Limit alcohol intake to no more than 1 drink per day for nonpregnant women. One drink equals 12 ounces of beer, 5 ounces of wine, or 1 ounces of hard liquor.  Do  not use street drugs.  Do not share needles.  Ask your health care provider for help if you need support or information about quitting drugs.  Tell your health care provider if you often feel depressed.  Tell your health care provider if you have ever been abused or do not feel safe at home.   This information is not intended to replace advice given to you by your health care provider. Make sure you discuss any questions you have with your health care provider.   Document Released: 10/25/2010 Document Revised: 05/02/2014 Document Reviewed: 03/13/2013 Elsevier Interactive Patient Education [employer].

## 2016-03-03 NOTE — Assessment & Plan Note (Signed)
CBE performed. Pending screening labs and urine pregnancy to restart OCP. Counseling regarding safe sex practices, healthy diet and exercise

## 2016-03-03 NOTE — Assessment & Plan Note (Signed)
Worsening anxiety. Increase lexapro. F/u 6-8 weeks.

## 2016-03-06 ENCOUNTER — Other Ambulatory Visit: Payer: Self-pay | Admitting: Family

## 2016-03-06 DIAGNOSIS — E039 Hypothyroidism, unspecified: Secondary | ICD-10-CM

## 2016-03-06 MED ORDER — LEVOTHYROXINE SODIUM 125 MCG PO TABS: 125.0000 ug | ORAL_TABLET | Freq: Every day | ORAL | 0 refills | Status: DC

## 2016-03-07 ENCOUNTER — Telehealth: Payer: Self-pay | Admitting: *Deleted

## 2016-03-07 NOTE — Telephone Encounter (Signed)
Patient requested lab results Pt contact [phone]

## 2016-03-07 NOTE — Telephone Encounter (Signed)
Left message for patient to return call back.  

## 2016-03-07 NOTE — Telephone Encounter (Signed)
Patient has been informed.

## 2016-04-14 ENCOUNTER — Ambulatory Visit (INDEPENDENT_AMBULATORY_CARE_PROVIDER_SITE_OTHER): Payer: 59 | Admitting: Family

## 2016-04-14 ENCOUNTER — Encounter: Payer: Self-pay | Admitting: Family

## 2016-04-14 DIAGNOSIS — F418 Other specified anxiety disorders: Secondary | ICD-10-CM

## 2016-04-14 DIAGNOSIS — F419 Anxiety disorder, unspecified: Principal | ICD-10-CM

## 2016-04-14 DIAGNOSIS — F329 Major depressive disorder, single episode, unspecified: Secondary | ICD-10-CM

## 2016-04-14 NOTE — Progress Notes (Signed)
Pre visit review using our clinic review tool, if applicable. No additional management support is needed unless otherwise documented below in the visit note. 

## 2016-04-14 NOTE — Patient Instructions (Signed)
Have a wonderful holiday!   Glad you are doing so well.

## 2016-04-14 NOTE — Progress Notes (Signed)
Subjective:    Patient ID: Patricia Ruiz, female    DOB: 31-Jan-1996, 20 y.o.   MRN: [id number]  CC: Patricia Ruiz is a 20 y.o. female who presents today for follow up.   HPI: Anxiety and depression- started lexapro at last visit 6 weeks ago. 'Feels 100 times better.' Sleeping well.  Awaiting on IUD placement; no plans for children at this time    HISTORY:  Past Medical History:  Diagnosis Date  . Depression   . Hypothyroidism 2011  . Hypothyroidism   . Kidney stone    Past Surgical History:  Procedure Laterality Date  . WISDOM TOOTH EXTRACTION     Family History  Problem Relation Age of Onset  . Depression Mother   . Hypertension Maternal Grandmother   . Thyroid disease Maternal Grandmother   . Depression Maternal Grandmother   . Hypertension Maternal Grandfather   . Cancer Maternal Grandfather     lung  . Stroke Other   . Diabetes Other   . Thyroid disease Paternal Grandmother   . Cancer Paternal Grandfather     prostate  . Depression Maternal Aunt     Allergies: Augmentin [amoxicillin-pot clavulanate] and Augmentin [amoxicillin-pot clavulanate] Current Outpatient Prescriptions on File Prior to Visit  Medication Sig Dispense Refill  . escitalopram (LEXAPRO) 10 MG tablet TAKE 1 TABLET (5 MG TOTAL) BY MOUTH DAILY. 90 tablet 1  . levonorgestrel-ethinyl estradiol (AVIANE) 0.1-20 MG-MCG tablet Take 1 tablet by mouth daily. 1 Package 11  . levothyroxine (SYNTHROID, LEVOTHROID) 125 MCG tablet Take 1 tablet (125 mcg total) by mouth daily before breakfast. 90 tablet 0   No current facility-administered medications on file prior to visit.     Social History  Substance Use Topics  . Smoking status: Never Smoker  . Smokeless tobacco: Never Used  . Alcohol use No    Review of Systems  Constitutional: Negative for chills and fever.  Respiratory: Negative for cough.   Cardiovascular: Negative for chest pain and palpitations.  Gastrointestinal: Negative  for nausea and vomiting.  Psychiatric/Behavioral: The patient is not nervous/anxious.       Objective:    BP 110/60   Pulse 92   Temp 97.9 F (36.6 C) (Oral)   Ht 5\' 1"  (1.549 m)   Wt 142 lb 12.8 oz (64.8 kg)   LMP 04/04/2016   SpO2 98%   BMI 26.98 kg/m  BP Readings from Last 3 Encounters:  04/14/16 110/60  03/03/16 108/70  03/02/15 106/66   Wt Readings from Last 3 Encounters:  04/14/16 142 lb 12.8 oz (64.8 kg)  03/03/16 138 lb 9.6 oz (62.9 kg) (67 %, Z= 0.44)*  03/02/15 117 lb 4 oz (53.2 kg) (31 %, Z= -0.48)*   * Growth percentiles are based on CDC 2-20 Years data.    Physical Exam  Constitutional: She appears well-developed and well-nourished.  Eyes: Conjunctivae are normal.  Cardiovascular: Normal rate, regular rhythm, normal heart sounds and normal pulses.   Pulmonary/Chest: Effort normal and breath sounds normal. She has no wheezes. She has no rhonchi. She has no rales.  Neurological: She is alert.  Skin: Skin is warm and dry.  Psychiatric: She has a normal mood and affect. Her speech is normal and behavior is normal. Thought content normal.  Vitals reviewed.      Assessment & Plan:   Problem List Items Addressed This Visit      Other   Anxiety and depression    Pleased to see doing so  well. Continue current regimen.          I am having Ms. Nichols maintain her escitalopram, levonorgestrel-ethinyl estradiol, levothyroxine, and misoprostol.   Meds ordered this encounter  Medications  . misoprostol (CYTOTEC) 200 MCG tablet    Return precautions given.   Risks, benefits, and alternatives of the medications and treatment plan prescribed today were discussed, and patient expressed understanding.   Education regarding symptom management and diagnosis given to patient on AVS.  Continue to follow with Rennie PlowmanMargaret Arnett, FNP for routine health maintenance.   Patricia Ruiz and I agreed with plan.   Rennie PlowmanMargaret Arnett, FNP

## 2016-04-14 NOTE — Assessment & Plan Note (Signed)
Pleased to see doing so well. Continue current regimen.

## 2016-06-01 ENCOUNTER — Other Ambulatory Visit: Payer: Self-pay | Admitting: Family

## 2016-06-01 DIAGNOSIS — E039 Hypothyroidism, unspecified: Secondary | ICD-10-CM

## 2016-06-20 NOTE — Telephone Encounter (Signed)
Pharmacy called looking for an update, pt called them to find out if there. Please advise, thank you!

## 2016-07-19 ENCOUNTER — Telehealth: Payer: Self-pay | Admitting: Family

## 2016-07-19 DIAGNOSIS — E039 Hypothyroidism, unspecified: Secondary | ICD-10-CM

## 2016-07-19 MED ORDER — LEVOTHYROXINE SODIUM 125 MCG PO TABS: 125.0000 ug | ORAL_TABLET | Freq: Every day | ORAL | 0 refills | Status: DC

## 2016-07-19 NOTE — Telephone Encounter (Signed)
Pt called about needing a refill for medication levothyroxine (SYNTHROID, LEVOTHROID) 125 MCG tablet.  Pharmacy is [id number] - [place], [place] - [address]  Call pt @ [phone]. Pt states it's ok to leave msg. Thank you!

## 2016-07-19 NOTE — Addendum Note (Signed)
Addended by: Elise BenneBOOTH, BROCK T on: 07/19/2016 01:09 PM   Modules accepted: Orders

## 2016-09-01 ENCOUNTER — Other Ambulatory Visit: Payer: Self-pay | Admitting: Family

## 2016-09-01 DIAGNOSIS — F3341 Major depressive disorder, recurrent, in partial remission: Secondary | ICD-10-CM

## 2016-09-05 ENCOUNTER — Telehealth: Payer: Self-pay | Admitting: *Deleted

## 2016-09-05 DIAGNOSIS — F3341 Major depressive disorder, recurrent, in partial remission: Secondary | ICD-10-CM

## 2016-09-05 MED ORDER — ESCITALOPRAM OXALATE 10 MG PO TABS: ORAL_TABLET | ORAL | 1 refills | Status: DC

## 2016-09-05 NOTE — Telephone Encounter (Signed)
Medication has been refilled.

## 2016-09-05 NOTE — Telephone Encounter (Signed)
Medication Refill requested for :Lexapro  [facility]  Return Contact [phone]

## 2016-10-14 ENCOUNTER — Other Ambulatory Visit: Payer: Self-pay | Admitting: Family

## 2016-10-14 DIAGNOSIS — E039 Hypothyroidism, unspecified: Secondary | ICD-10-CM

## 2017-01-28 ENCOUNTER — Other Ambulatory Visit: Payer: Self-pay | Admitting: Family

## 2017-01-28 DIAGNOSIS — E039 Hypothyroidism, unspecified: Secondary | ICD-10-CM

## 2017-03-07 ENCOUNTER — Encounter: Payer: Self-pay | Admitting: Family

## 2017-03-08 ENCOUNTER — Encounter: Payer: Self-pay | Admitting: Family

## 2017-03-09 ENCOUNTER — Ambulatory Visit (INDEPENDENT_AMBULATORY_CARE_PROVIDER_SITE_OTHER): Payer: 59 | Admitting: Internal Medicine

## 2017-03-09 ENCOUNTER — Encounter: Payer: Self-pay | Admitting: Internal Medicine

## 2017-03-09 ENCOUNTER — Other Ambulatory Visit: Payer: Self-pay | Admitting: Family

## 2017-03-09 VITALS — BP 118/82 | HR 72 | Temp 99.0°F | Resp 16 | Wt 155.1 lb

## 2017-03-09 DIAGNOSIS — Z0001 Encounter for general adult medical examination with abnormal findings: Secondary | ICD-10-CM

## 2017-03-09 DIAGNOSIS — E039 Hypothyroidism, unspecified: Secondary | ICD-10-CM

## 2017-03-09 DIAGNOSIS — R5383 Other fatigue: Secondary | ICD-10-CM

## 2017-03-09 DIAGNOSIS — Z0000 Encounter for general adult medical examination without abnormal findings: Secondary | ICD-10-CM

## 2017-03-09 DIAGNOSIS — E559 Vitamin D deficiency, unspecified: Secondary | ICD-10-CM | POA: Diagnosis not present

## 2017-03-09 DIAGNOSIS — Z23 Encounter for immunization: Secondary | ICD-10-CM | POA: Diagnosis not present

## 2017-03-09 DIAGNOSIS — N200 Calculus of kidney: Secondary | ICD-10-CM

## 2017-03-09 DIAGNOSIS — F3341 Major depressive disorder, recurrent, in partial remission: Secondary | ICD-10-CM

## 2017-03-09 DIAGNOSIS — Z0184 Encounter for antibody response examination: Secondary | ICD-10-CM | POA: Diagnosis not present

## 2017-03-09 DIAGNOSIS — E785 Hyperlipidemia, unspecified: Secondary | ICD-10-CM

## 2017-03-09 LAB — POCT URINALYSIS DIPSTICK
Bilirubin, UA: NEGATIVE
GLUCOSE UA: NEGATIVE
Ketones, UA: NEGATIVE
Nitrite, UA: NEGATIVE
PROTEIN UA: NEGATIVE
SPEC GRAV UA: 1.015 (ref 1.010–1.025)
UROBILINOGEN UA: 0.2 U/dL
pH, UA: 7 (ref 5.0–8.0)

## 2017-03-09 MED ORDER — LEVOTHYROXINE SODIUM 125 MCG PO TABS: 125.0000 ug | ORAL_TABLET | Freq: Every day | ORAL | 0 refills | Status: DC

## 2017-03-09 NOTE — Progress Notes (Addendum)
Subjective:     Patient ID: Patricia Ruiz, female   DOB: June 26, 1995, 21 y.o.   MRN: [id number]  Pt presents for annual CPE 1. Needs refill of Synthyroid and reports never took Cytotec and does not know what that medication is. She has been off medication x 2 weeks. Reports reduced energy, increased sleep, dry skin but denies constipation  2. H/o recurrent kidney stones x 7 last episode last month. She has been to ED for evaluation but has not had formal w/u and has not had kidney stones checked. Agreeable referral to urology  3. FH BCC mGM and atypical moles in dad will refer to dermatology  4. She is sexually active with same partner since 2016 and declines STD testing.    Medication Refill  This is a chronic problem. The current episode started more than 1 year ago. The problem occurs daily. The problem has been waxing and waning. Associated symptoms include fatigue. Pertinent negatives include no chest pain. Exacerbated by: N/A. Treatments tried: N/A. on levothyroxine for hypothyroidism  Improvement on treatment: waxes and wanes     Past Medical History:  Diagnosis Date  . Depression   . Hypothyroidism 2011   since age 21 y.o   . Hypothyroidism   . Kidney stones    Past Surgical History:  Procedure Laterality Date  . WISDOM TOOTH EXTRACTION     Family History  Problem Relation Age of Onset  . Depression Mother   . Hypertension Maternal Grandmother   . Thyroid disease Maternal Grandmother   . Depression Maternal Grandmother   . Cancer Maternal Grandmother        BCC   . Hypertension Maternal Grandfather   . Cancer Maternal Grandfather        lung  . Stroke Other   . Diabetes Other   . Thyroid disease Paternal Grandmother   . Cancer Paternal Grandfather        prostate  . Depression Maternal Aunt    Social History   Socioeconomic History  . Marital status: Single    Spouse name: Not on file  . Number of children: Not on file  . Years of education: Not on file   . Highest education level: Not on file  Social Needs  . Financial resource strain: Not on file  . Food insecurity - worry: Not on file  . Food insecurity - inability: Not on file  . Transportation needs - medical: Not on file  . Transportation needs - non-medical: Not on file  Occupational History  . Not on file  Tobacco Use  . Smoking status: Light Tobacco Smoker  . Smokeless tobacco: Never Used  . Tobacco comment: 1 pk lasts weeks as of 03/09/17   Substance and Sexual Activity  . Alcohol use: No  . Drug use: No  . Sexual activity: Yes    Birth control/protection: Pill  Other Topics Concern  . Not on file  Social History Narrative   [place] [employer]. 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    [facility] [employer]   Exercise - active, trying to get back into yoga   Diet -healthy   Sexually active with 1 partner since 2016     Current Outpatient Medications:  .  escitalopram (LEXAPRO) 10 MG tablet, TAKE 1 TABLET BY MOUTH EVERY DAY, Disp: 90 tablet, Rfl: 1 .  levonorgestrel-ethinyl estradiol (AVIANE) 0.1-20 MG-MCG tablet, Take 1 tablet by mouth daily., Disp:  1 Package, Rfl: 11 .  levothyroxine (SYNTHROID, LEVOTHROID) 125 MCG tablet, Take 1 tablet (125 mcg total) daily before breakfast by mouth. On empty stomach before other meds, Disp: 60 tablet, Rfl: 0   Allergies  Allergen Reactions  . Augmentin [Amoxicillin-Pot Clavulanate] Hives  . Augmentin [Amoxicillin-Pot Clavulanate] Hives   Review of Systems  Constitutional: Positive for fatigue.  Respiratory: Negative for shortness of breath.   Cardiovascular: Negative for chest pain.  Gastrointestinal: Negative for constipation.  Genitourinary:       +recurrent kidney stones   Skin:       +dry skin    Vitals:   03/09/17 1452  BP: 118/82  Pulse: 72  Resp: 16  Temp: 99 F (37.2 C)  SpO2: 98%      Objective:   Physical Exam  Constitutional: She is oriented to person,  place, and time. Vital signs are normal. She appears well-developed and well-nourished.  HENT:  Head: Normocephalic and atraumatic.  Mouth/Throat: Oropharynx is clear and moist and mucous membranes are normal.  Eyes: Conjunctivae and EOM are normal. Pupils are equal, round, and reactive to light.  Cardiovascular: Normal rate, regular rhythm and normal heart sounds.  Pulmonary/Chest: Effort normal and breath sounds normal.  Abdominal: Soft. Normal appearance and bowel sounds are normal. There is no tenderness.  Obese ab  Mild suprapubic ttp on exam pt has to urinate   Neurological: She is alert and oriented to person, place, and time.  Skin: Skin is warm and dry.  [place] to trunk   Psychiatric: She has a normal mood and affect. Her speech is normal and behavior is normal. Judgment and thought content normal. Cognition and memory are normal.       Assessment:     1. Hypothyroidism dx'ed age 21 y.o last checked uncontrolled tsh was 13.07 03/03/16  2. Kidney stones  3. Annual wellness  4. HLD 5. Fatigue could be multifactorial (I.e thyroid etc) 6. Vit D def 03/03/16 23.89     Plan:     1. Refill levothyroxine 125 mcg advised in am on empty stomach and no other meds for 3 hours  Check labs today CMET, CBC, UA, vit D, TSH, free T4, anti TPO abx, hep B immune status Check lipid before next visit fasting   F/u in 2 months if unable to regulate consider referral to Endocrine in future  2. Refer to urology for w/u of kidney stones  3. Will do pap at f/u in 2 months when age 21 LMP 2 weeks ago  Flu and Tdap given today  Need to get record of HPV [place] care everywhere and unclear when and was was given if have 3 vxs pt states remembers possibly only 1  Referred for skin check tbse with FH Declines STD check today  PHQ 9 13 assess at f/u on Lexapro cont same dose for now  Sexually active with 1 partner x 2 years.  Counseled on smoking cessation today,  denies etoh and drugs   4. Check lipid fasting before next visit    5. See labs and f/u in 2 months  6. Check vit D today

## 2017-03-09 NOTE — Patient Instructions (Addendum)
1. You were given the flu shot and Tdap vaccine today  2. Follow up in 2 months for pap and thyroid follow up. Please take thyroid medication 1st thing in am on empty stomach and other medication in 3 hours.  3. We will send you to urology for kidney stones and dermatology for a total body skin exam  4. Please come back fasting/without food for cholesterol check  5. Get records of your HPV vaccine if possible [facility] [place] [facility]   Hypothyroidism Hypothyroidism is a disorder of the thyroid. The thyroid is a large gland that is located in the lower front of the neck. The thyroid releases hormones that control how the body works. With hypothyroidism, the thyroid does not make enough of these hormones. What are the causes? Causes of hypothyroidism may include:  Viral infections.  Pregnancy.  Your own defense system (immune system) attacking your thyroid.  Certain medicines.  Birth defects.  Past radiation treatments to your head or neck.  Past treatment with radioactive iodine.  Past surgical removal of part or all of your thyroid.  Problems with the gland that is located in the center of your brain (pituitary).  What are the signs or symptoms? Signs and symptoms of hypothyroidism may include:  Feeling as though you have no energy (lethargy).  Inability to tolerate cold.  Weight gain that is not explained by a change in diet or exercise habits.  Dry skin.  Coarse hair.  Menstrual irregularity.  Slowing of thought processes.  Constipation.  Sadness or depression.  How is this diagnosed? Your health care provider may diagnose hypothyroidism with blood tests and ultrasound tests. How is this treated? Hypothyroidism is treated with medicine that replaces the hormones that your body does not make. After you begin treatment, it may take several weeks for symptoms to go away. Follow these instructions at home:  Take medicines only as directed by your health care  provider.  If you start taking any new medicines, tell your health care provider.  Keep all follow-up visits as directed by your health care provider. This is important. As your condition improves, your dosage needs may change. You will need to have blood tests regularly so that your health care provider can watch your condition. Contact a health care provider if:  Your symptoms do not get better with treatment.  You are taking thyroid replacement medicine and: ? You sweat excessively. ? You have tremors. ? You feel anxious. ? You lose weight rapidly. ? You cannot tolerate heat. ? You have emotional swings. ? You have diarrhea. ? You feel weak. Get help right away if:  You develop chest pain.  You develop an irregular heartbeat.  You develop a rapid heartbeat. This information is not intended to replace advice given to you by your health care provider. Make sure you discuss any questions you have with your health care provider. Document Released: 04/11/2005 Document Revised: 09/17/2015 Document Reviewed: 08/27/2013 Elsevier Interactive Patient Education  2017 Else[employer] Inc. Kidney Stones Kidney stones (urolithiasis) are rock-like masses that form inside of the kidneys. Kidneys are organs that make pee (urine). A kidney stone can cause very bad pain and can block the flow of pee. The stone usually leaves your body (passes) through your pee. You may need to have a doctor take out the stone. Follow these instructions at home: Eating and drinking  Drink enough fluid to keep your pee clear or pale yellow. This will help you pass the stone.  If told by your doctor, change the foods you eat (your diet). This may include: ? Limiting how much salt (sodium) you eat. ? Eating more fruits and vegetables. ? Limiting how much meat, poultry, fish, and eggs you eat.  Follow instructions from your doctor about eating or drinking restrictions. General instructions  Collect pee samples as  told by your doctor. You may need to collect a pee sample: ? 24 hours after a stone comes out. ? 8-12 weeks after a stone comes out, and every 6-12 months after that.  Strain your pee every time you pee (urinate), for as long as told. Use the strainer that your doctor recommends.  Do not throw out the stone. Keep it so that it can be tested by your doctor.  Take over-the-counter and prescription medicines only as told by your doctor.  Keep all follow-up visits as told by your doctor. This is important. You may need follow-up tests. Preventing kidney stones To prevent another kidney stone:  Drink enough fluid to keep your pee clear or pale yellow. This is the best way to prevent kidney stones.  Eat healthy foods.  Avoid certain foods as told by your doctor. You may be told to eat less protein.  Stay at a healthy weight.  Contact a doctor if:  You have pain that gets worse or does not get better with medicine. Get help right away if:  You have a fever or chills.  You get very bad pain.  You get new pain in your belly (abdomen).  You pass out (faint).  You cannot pee. This information is not intended to replace advice given to you by your health care provider. Make sure you discuss any questions you have with your health care provider. Document Released: 09/28/2007 Document Revised: 12/29/2015 Document Reviewed: 12/29/2015 Elsevier Interactive Patient Education  2017 [employer]

## 2017-03-29 ENCOUNTER — Encounter: Payer: Self-pay | Admitting: Urology

## 2017-03-29 ENCOUNTER — Ambulatory Visit
Admission: RE | Admit: 2017-03-29 | Discharge: 2017-03-29 | Disposition: A | Discharge status: Home / Self Care | Payer: 59 | Source: Ambulatory Visit | Attending: Urology | Admitting: Urology

## 2017-03-29 ENCOUNTER — Ambulatory Visit (INDEPENDENT_AMBULATORY_CARE_PROVIDER_SITE_OTHER): Payer: 59 | Admitting: Urology

## 2017-03-29 VITALS — BP 124/85 | HR 80 | Ht 61.0 in | Wt 154.4 lb

## 2017-03-29 DIAGNOSIS — N200 Calculus of kidney: Secondary | ICD-10-CM

## 2017-03-29 LAB — URINALYSIS, COMPLETE
Bilirubin, UA: NEGATIVE
GLUCOSE, UA: NEGATIVE
KETONES UA: NEGATIVE
Leukocytes, UA: NEGATIVE
NITRITE UA: NEGATIVE
Protein, UA: NEGATIVE
SPEC GRAV UA: 1.02 (ref 1.005–1.030)
UUROB: 0.2 mg/dL (ref 0.2–1.0)
pH, UA: 7 (ref 5.0–7.5)

## 2017-03-29 LAB — MICROSCOPIC EXAMINATION

## 2017-03-29 NOTE — Progress Notes (Signed)
03/29/2017 9:45 AM   Patricia Ruiz 05/06/1995 [id number]  Referring provider: Allegra GranaArnett, Margaret G, FNP [address] [place], [place] [address]  Chief Complaint  Patient presents with  . Nephrolithiasis    HPI: Patricia MonarchCatherine Ruiz is a 21 year old female seen in consultation at the request of Dr. Jill PolingMclean-Scocuzz for evaluation of nephrolithiasis.a she states in the past 4 years she has passed 7 kidney stones with the last episode October 2018.  At that time she was having left flank pain that radiated to the left lower quadrant.  She has never required surgical intervention and has had no previous urologic evaluation.  She does have a family history of stone disease.  She is presently asymptomatic. Her last imaging was in 2016.  A CT showed a 5 x 8 mm lower pole left renal calculus and a smaller 3 mm calculus.  There was a 2 mm right renal calculus.  She had an episode of renal colic in July 2016 and a KUB showed left nephrolithiasis and a probable 3 mm left distal ureteral calculus.  There is no history of chronic bowel disease.  PMH: Past Medical History:  Diagnosis Date  . Depression   . Hypothyroidism 2011   since age 21 y.o   . Hypothyroidism   . Kidney stones     Surgical History: Past Surgical History:  Procedure Laterality Date  . WISDOM TOOTH EXTRACTION      Home Medications:  Allergies as of 03/29/2017      Reactions   Augmentin [amoxicillin-pot Clavulanate] Hives   Augmentin [amoxicillin-pot Clavulanate] Hives      Medication List        Accurate as of 03/29/17  9:45 AM. Always use your most recent med list.          escitalopram 10 MG tablet Commonly known as:  LEXAPRO TAKE 1 TABLET BY MOUTH EVERY DAY   levonorgestrel-ethinyl estradiol 0.1-20 MG-MCG tablet Commonly known as:  AVIANE Take 1 tablet by mouth daily.   levothyroxine 125 MCG tablet Commonly known as:  SYNTHROID, LEVOTHROID Take 1 tablet (125 mcg total) daily before  breakfast by mouth. On empty stomach before other meds       Allergies:  Allergies  Allergen Reactions  . Augmentin [Amoxicillin-Pot Clavulanate] Hives  . Augmentin [Amoxicillin-Pot Clavulanate] Hives    Family History: Family History  Problem Relation Age of Onset  . Depression Mother   . Hypertension Maternal Grandmother   . Thyroid disease Maternal Grandmother   . Depression Maternal Grandmother   . Cancer Maternal Grandmother        BCC   . Hypertension Maternal Grandfather   . Cancer Maternal Grandfather        lung  . Stroke Other   . Diabetes Other   . Thyroid disease Paternal Grandmother   . Cancer Paternal Grandfather        prostate  . Depression Maternal Aunt   . Bladder Cancer Neg Hx   . Kidney cancer Neg Hx     Social History:  reports that she has been smoking.  she has never used smokeless tobacco. She reports that she does not drink alcohol or use drugs.  ROS: UROLOGY Frequent Urination?: Yes Hard to postpone urination?: Yes Burning/pain with urination?: No Get up at night to urinate?: No Leakage of urine?: No Urine stream starts and stops?: No Trouble starting stream?: No Do you have to strain to urinate?: No Blood in urine?: No Urinary tract  infection?: No Sexually transmitted disease?: No Injury to kidneys or bladder?: No Painful intercourse?: No Weak stream?: No Currently pregnant?: No Vaginal bleeding?: No Last menstrual period?: 03/02/2017  Gastrointestinal Nausea?: No Vomiting?: No Indigestion/heartburn?: No Diarrhea?: No Constipation?: No  Constitutional Fever: No Night sweats?: No Weight loss?: No Fatigue?: No  Skin Skin rash/lesions?: No Itching?: No  Eyes Blurred vision?: No Double vision?: No  Ears/Nose/Throat Sore throat?: No Sinus problems?: No  Hematologic/Lymphatic Swollen glands?: No Easy bruising?: No  Cardiovascular Leg swelling?: No Chest pain?: No  Respiratory Cough?: No Shortness of  breath?: No  Endocrine Excessive thirst?: No  Musculoskeletal Back pain?: Yes Joint pain?: Yes  Neurological Headaches?: No Dizziness?: No  Psychologic Depression?: Yes Anxiety?: Yes  Physical Exam: BP 124/85 (BP Location: Right Arm, Patient Position: Sitting, Cuff Size: Normal)   Pulse 80   Ht 5\' 1"  (1.549 m)   Wt 154 lb 6.4 oz (70 kg)   BMI 29.17 kg/m   Constitutional:  Alert and oriented, No acute distress. HEENT: [place] AT, moist mucus membranes.  Trachea midline, no masses. Cardiovascular: No clubbing, cyanosis, or edema. Respiratory: Normal respiratory effort, no increased work of breathing. GI: Abdomen is soft, nontender, nondistended, no abdominal masses GU: No CVA tenderness.  Skin: No rashes, bruises or suspicious lesions. Lymph: No cervical or inguinal adenopathy. Neurologic: Grossly intact, no focal deficits, moving all 4 extremities. Psychiatric: Normal mood and affect.  Laboratory Data: Lab Results  Component Value Date   WBC 8.4 03/03/2016   HGB 14.8 03/03/2016   HCT 43.3 03/03/2016   MCV 88.6 03/03/2016   PLT 273.0 03/03/2016    Lab Results  Component Value Date   CREATININE 0.57 03/03/2016    Lab Results  Component Value Date   HGBA1C 5.1 03/03/2016    Urinalysis Dipstick: 3+ blood Microscopy: 3-10 RBC (currently menstruating)   Assessment & Plan:    1. Nephrolithiasis She is presently symptomatic.  A KUB and renal ultrasound was ordered.  Proceed with a metabolic evaluation through [facility].  - Urinalysis, Complete - Abdomen 1 view (KUB); Future - Ultrasound renal complete; Future    Riki AltesScott C Stoioff, MD  [facility] [address], [address] [place], [place] [address] [phone]

## 2017-04-06 ENCOUNTER — Other Ambulatory Visit: Payer: Self-pay | Admitting: Family

## 2017-04-06 DIAGNOSIS — Z0000 Encounter for general adult medical examination without abnormal findings: Secondary | ICD-10-CM

## 2017-05-04 ENCOUNTER — Other Ambulatory Visit (INDEPENDENT_AMBULATORY_CARE_PROVIDER_SITE_OTHER): Payer: 59

## 2017-05-04 DIAGNOSIS — E559 Vitamin D deficiency, unspecified: Secondary | ICD-10-CM | POA: Diagnosis not present

## 2017-05-04 DIAGNOSIS — E785 Hyperlipidemia, unspecified: Secondary | ICD-10-CM

## 2017-05-04 DIAGNOSIS — R5383 Other fatigue: Secondary | ICD-10-CM | POA: Diagnosis not present

## 2017-05-04 DIAGNOSIS — Z0184 Encounter for antibody response examination: Secondary | ICD-10-CM | POA: Diagnosis not present

## 2017-05-04 DIAGNOSIS — E039 Hypothyroidism, unspecified: Secondary | ICD-10-CM | POA: Diagnosis not present

## 2017-05-04 LAB — COMPREHENSIVE METABOLIC PANEL
ALK PHOS: 76 U/L (ref 39–117)
ALT: 17 U/L (ref 0–35)
AST: 17 U/L (ref 0–37)
Albumin: 4.4 g/dL (ref 3.5–5.2)
BILIRUBIN TOTAL: 0.5 mg/dL (ref 0.2–1.2)
BUN: 13 mg/dL (ref 6–23)
CO2: 26 mEq/L (ref 19–32)
Calcium: 9.6 mg/dL (ref 8.4–10.5)
Chloride: 103 mEq/L (ref 96–112)
Creatinine, Ser: 0.66 mg/dL (ref 0.40–1.20)
GFR: 120.01 mL/min (ref >60.00)
GLUCOSE: 95 mg/dL (ref 70–99)
Potassium: 5 mEq/L (ref 3.5–5.1)
SODIUM: 136 meq/L (ref 135–145)
TOTAL PROTEIN: 7.1 g/dL (ref 6.0–8.3)

## 2017-05-04 LAB — CBC WITH DIFFERENTIAL/PLATELET
BASOS ABS: 0 10*3/uL (ref 0.0–0.1)
Basophils Relative: 0.3 % (ref 0.0–3.0)
EOS ABS: 0.2 10*3/uL (ref 0.0–0.7)
Eosinophils Relative: 2.3 % (ref 0.0–5.0)
HEMATOCRIT: 44.4 % (ref 36.0–46.0)
HEMOGLOBIN: 14.5 g/dL (ref 12.0–15.0)
LYMPHS PCT: 25.8 % (ref 12.0–46.0)
Lymphs Abs: 1.8 10*3/uL (ref 0.7–4.0)
MCHC: 32.7 g/dL (ref 30.0–36.0)
MCV: 95.5 fl (ref 78.0–100.0)
Monocytes Absolute: 0.4 10*3/uL (ref 0.1–1.0)
Monocytes Relative: 6.4 % (ref 3.0–12.0)
Neutro Abs: 4.5 10*3/uL (ref 1.4–7.7)
Neutrophils Relative %: 65.2 % (ref 43.0–77.0)
Platelets: 306 10*3/uL (ref 150.0–400.0)
RBC: 4.64 Mil/uL (ref 3.87–5.11)
RDW: 13.9 % (ref 11.5–15.5)
WBC: 6.9 10*3/uL (ref 4.0–10.5)

## 2017-05-04 LAB — TSH: TSH: 6.66 u[IU]/mL — AB (ref 0.35–4.50)

## 2017-05-04 LAB — LIPID PANEL
CHOL/HDL RATIO: 4
Cholesterol: 200 mg/dL (ref 0–200)
HDL: 52.5 mg/dL (ref >39.00)
LDL CALC: 111 mg/dL — AB (ref 0–99)
NonHDL: 147.72
TRIGLYCERIDES: 184 mg/dL — AB (ref 0.0–149.0)
VLDL: 36.8 mg/dL (ref 0.0–40.0)

## 2017-05-04 LAB — T4, FREE: Free T4: 1.02 ng/dL (ref 0.60–1.60)

## 2017-05-04 LAB — VITAMIN D 25 HYDROXY (VIT D DEFICIENCY, FRACTURES): VITD: 19.66 ng/mL — ABNORMAL LOW (ref 30.00–100.00)

## 2017-05-04 NOTE — Addendum Note (Signed)
Addended by: Warden FillersWRIGHT, Raydell Maners S on: 05/04/2017 09:11 AM   Modules accepted: Orders

## 2017-05-05 LAB — THYROID PEROXIDASE ANTIBODY: THYROID PEROXIDASE ANTIBODY: 149 [IU]/mL — AB (ref <9)

## 2017-05-05 LAB — HEPATITIS B SURFACE ANTIBODY, QUANTITATIVE: Hepatitis B-Post: <5 m[IU]/mL — ABNORMAL LOW (ref >=10)

## 2017-05-07 ENCOUNTER — Other Ambulatory Visit: Payer: Self-pay | Admitting: Internal Medicine

## 2017-05-07 DIAGNOSIS — E039 Hypothyroidism, unspecified: Secondary | ICD-10-CM

## 2017-05-07 DIAGNOSIS — E559 Vitamin D deficiency, unspecified: Secondary | ICD-10-CM

## 2017-05-07 MED ORDER — CHOLECALCIFEROL 1.25 MG (50000 UT) PO CAPS: 50000.0000 [IU] | ORAL_CAPSULE | ORAL | 1 refills | Status: DC

## 2017-05-07 MED ORDER — LEVOTHYROXINE SODIUM 137 MCG PO TABS: 137.0000 ug | ORAL_TABLET | Freq: Every day | ORAL | 2 refills | Status: DC

## 2017-05-09 ENCOUNTER — Ambulatory Visit: Payer: 59 | Admitting: Internal Medicine

## 2017-06-22 ENCOUNTER — Ambulatory Visit (INDEPENDENT_AMBULATORY_CARE_PROVIDER_SITE_OTHER): Payer: 59 | Admitting: Internal Medicine

## 2017-06-22 ENCOUNTER — Encounter: Payer: Self-pay | Admitting: Internal Medicine

## 2017-06-22 VITALS — BP 98/70 | HR 85 | Temp 98.7°F | Ht 61.5 in | Wt 153.6 lb

## 2017-06-22 DIAGNOSIS — Z23 Encounter for immunization: Secondary | ICD-10-CM

## 2017-06-22 DIAGNOSIS — F419 Anxiety disorder, unspecified: Secondary | ICD-10-CM | POA: Diagnosis not present

## 2017-06-22 DIAGNOSIS — F329 Major depressive disorder, single episode, unspecified: Secondary | ICD-10-CM | POA: Diagnosis not present

## 2017-06-22 DIAGNOSIS — E039 Hypothyroidism, unspecified: Secondary | ICD-10-CM

## 2017-06-22 DIAGNOSIS — E559 Vitamin D deficiency, unspecified: Secondary | ICD-10-CM | POA: Insufficient documentation

## 2017-06-22 DIAGNOSIS — N200 Calculus of kidney: Secondary | ICD-10-CM | POA: Diagnosis not present

## 2017-06-22 NOTE — Patient Instructions (Addendum)
Hepatitis B shot due in 08/20/17 and 12/20/17  07/03/17 TSH check  Pap at f/u in 4 months  Hepatitis B Vaccine, Recombinant injection What is this medicine? HEPATITIS B VACCINE (hep uh TAHY tis B VAK seen) is a vaccine. It is used to prevent an infection with the hepatitis B virus. This medicine may be used for other purposes; ask your health care provider or pharmacist if you have questions. COMMON BRAND NAME(S): Engerix-B, Recombivax HB What should I tell my health care provider before I take this medicine? They need to know if you have any of these conditions: -fever, infection -heart disease -hepatitis B infection -immune system problems -kidney disease -an unusual or allergic reaction to vaccines, yeast, other medicines, foods, dyes, or preservatives -pregnant or trying to get pregnant -breast-feeding How should I use this medicine? This vaccine is for injection into a muscle. It is given by a health care professional. A copy of Vaccine Information Statements will be given before each vaccination. Read this sheet carefully each time. The sheet may change frequently. Talk to your pediatrician regarding the use of this medicine in children. While this drug may be prescribed for children as young as newborn for selected conditions, precautions do apply. Overdosage: If you think you have taken too much of this medicine contact a poison control center or emergency room at once. NOTE: This medicine is only for you. Do not share this medicine with others. What if I miss a dose? It is important not to miss your dose. Call your doctor or health care professional if you are unable to keep an appointment. What may interact with this medicine? -medicines that suppress your immune function like adalimumab, anakinra, infliximab -medicines to treat cancer -steroid medicines like prednisone or cortisone This list may not describe all possible interactions. Give your health care provider a list of all  the medicines, herbs, non-prescription drugs, or dietary supplements you use. Also tell them if you smoke, drink alcohol, or use illegal drugs. Some items may interact with your medicine. What should I watch for while using this medicine? See your health care provider for all shots of this vaccine as directed. You must have 3 shots of this vaccine for protection from hepatitis B infection. Tell your doctor right away if you have any serious or unusual side effects after getting this vaccine. What side effects may I notice from receiving this medicine? Side effects that you should report to your doctor or health care professional as soon as possible: -allergic reactions like skin rash, itching or hives, swelling of the face, lips, or tongue -breathing problems -confused, irritated -fast, irregular heartbeat -flu-like syndrome -numb, tingling pain -seizures -unusually weak or tired Side effects that usually do not require medical attention (report to your doctor or health care professional if they continue or are bothersome): -diarrhea -fever -headache -loss of appetite -muscle pain -nausea -pain, redness, swelling, or irritation at site where injected -tiredness This list may not describe all possible side effects. Call your doctor for medical advice about side effects. You may report side effects to FDA at 1-800-FDA-1088. Where should I keep my medicine? This drug is given in a hospital or clinic and will not be stored at home. NOTE: This sheet is a summary. It may not cover all possible information. If you have questions about this medicine, talk to your doctor, pharmacist, or health care provider.  2018 Elsevier/Gold Standard (2013-08-12 13:26:01)

## 2017-06-25 ENCOUNTER — Encounter: Payer: Self-pay | Admitting: Internal Medicine

## 2017-06-25 NOTE — Progress Notes (Signed)
Chief Complaint  Patient presents with  . Follow-up   Follow up  1. Kidney stones saw urology but per pt busy with work and school and unable to f/u until summer 2019 with urology  2. Hypothyroidism on increased levothyroxine 125 to 137 mg qam will sch repeat TSH does not want to do today  3. Vit D def she is taking vit D weekly  4. Anxiety and depression lexapro 10 is working    Review of Systems  Constitutional: Negative for weight loss.  HENT: Negative for hearing loss.   Eyes: Negative for blurred vision.  Respiratory: Negative for shortness of breath.   Cardiovascular: Negative for chest pain.  Gastrointestinal: Negative for abdominal pain.  Musculoskeletal: Negative for falls.  Skin: Negative for rash.  Neurological: Negative for headaches.  Psychiatric/Behavioral: Negative for depression. The patient is not nervous/anxious.    Past Medical History:  Diagnosis Date  . Depression   . Hypothyroidism 2011   since age 25 y.o   . Hypothyroidism   . Kidney stones    Past Surgical History:  Procedure Laterality Date  . WISDOM TOOTH EXTRACTION     Family History  Problem Relation Age of Onset  . Depression Mother   . Hypertension Maternal Grandmother   . Thyroid disease Maternal Grandmother   . Depression Maternal Grandmother   . Cancer Maternal Grandmother        BCC   . Hypertension Maternal Grandfather   . Cancer Maternal Grandfather        lung  . Stroke Other   . Diabetes Other   . Thyroid disease Paternal Grandmother   . Cancer Paternal Grandfather        prostate  . Depression Maternal Aunt   . Bladder Cancer Neg Hx   . Kidney cancer Neg Hx    Social History   Socioeconomic History  . Marital status: Single    Spouse name: Not on file  . Number of children: Not on file  . Years of education: Not on file  . Highest education level: Not on file  Social Needs  . Financial resource strain: Not on file  . Food insecurity - worry: Not on file  . Food  insecurity - inability: Not on file  . Transportation needs - medical: Not on file  . Transportation needs - non-medical: Not on file  Occupational History  . Not on file  Tobacco Use  . Smoking status: Light Tobacco Smoker  . Smokeless tobacco: Never Used  . Tobacco comment: 1 pk lasts weeks as of 03/09/17   Substance and Sexual Activity  . Alcohol use: No  . Drug use: No  . Sexual activity: Yes    Birth control/protection: Pill  Other Topics Concern  . 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    Going at [facility] [facility]   Exercise - active, trying to get back into yoga   Diet -healthy   Sexually active with 1 partner since 2016    Current Meds  Medication Sig  . Cholecalciferol 50000 units capsule Take 1 capsule (50,000 Units total) by mouth once a week.  . escitalopram (LEXAPRO) 10 MG tablet TAKE 1 TABLET BY MOUTH EVERY DAY  . levothyroxine (SYNTHROID, LEVOTHROID) 137 MCG tablet Take 1 tablet (137 mcg total) by mouth daily before breakfast. On empty stomach 2-3 hours before other meds  .  SRONYX 0.1-20 MG-MCG tablet TAKE 1 TABLET BY MOUTH DAILY.   Allergies  Allergen Reactions  . Augmentin [Amoxicillin-Pot Clavulanate] Hives  . Augmentin [Amoxicillin-Pot Clavulanate] Hives   Recent Results (from the past 2160 hour(s))  Urinalysis, Complete     Status: Abnormal   Collection Time: 03/29/17  8:59 AM  Result Value Ref Range   Specific Gravity, UA 1.020 1.005 - 1.030   pH, UA 7.0 5.0 - 7.5   Color, UA Yellow Yellow   Appearance Ur Clear Clear   Leukocytes, UA Negative Negative   Protein, UA Negative Negative/Trace   Glucose, UA Negative Negative   Ketones, UA Negative Negative   RBC, UA 3+ (A) Negative   Bilirubin, UA Negative Negative   Urobilinogen, Ur 0.2 0.2 - 1.0 mg/dL   Nitrite, UA Negative Negative   Microscopic Examination See below:   Microscopic Examination      Status: Abnormal   Collection Time: 03/29/17  8:59 AM  Result Value Ref Range   WBC, UA 0-5 0 - 5 /hpf   RBC, UA 3-10 (A) 0 - 2 /hpf   Epithelial Cells (non renal) 0-10 0 - 10 /hpf   Mucus, UA Present (A) Not Estab.   Bacteria, UA Few (A) None seen/Few  Hepatitis B surface antibody     Status: Abnormal   Collection Time: 05/04/17  9:11 AM  Result Value Ref Range   Hepatitis B-Post <5 (L) > OR = 10 mIU/mL    Comment: . Patient does not have immunity to hepatitis B virus. . For additional information, please refer to http://education.[facility].com/faq/FAQ105 (This link is being provided for informational/ educational purposes only).   Thyroid peroxidase antibody     Status: Abnormal   Collection Time: 05/04/17  9:11 AM  Result Value Ref Range   Thyroperoxidase Ab SerPl-aCnc 149 (H) <9 IU/mL  T4, free     Status: None   Collection Time: 05/04/17  9:11 AM  Result Value Ref Range   Free T4 1.02 0.60 - 1.60 ng/dL    Comment: Specimens from patients who are undergoing biotin therapy and /or ingesting biotin supplements may contain high levels of biotin.  The higher biotin concentration in these specimens interferes with this Free T4 assay.  Specimens that contain high levels  of biotin may cause false high results for this Free T4 assay.  Please interpret results in light of the total clinical presentation of the patient.    TSH     Status: Abnormal   Collection Time: 05/04/17  9:11 AM  Result Value Ref Range   TSH 6.66 (H) 0.35 - 4.50 uIU/mL  Comp Met (CMET)     Status: None   Collection Time: 05/04/17  9:11 AM  Result Value Ref Range   Sodium 136 135 - 145 mEq/L   Potassium 5.0 3.5 - 5.1 mEq/L   Chloride 103 96 - 112 mEq/L   CO2 26 19 - 32 mEq/L   Glucose, Bld 95 70 - 99 mg/dL   BUN 13 6 - 23 mg/dL   Creatinine, Ser 0.66 0.40 - 1.20 mg/dL   Total Bilirubin 0.5 0.2 - 1.2 mg/dL   Alkaline Phosphatase 76 39 - 117 U/L   AST 17 0 - 37 U/L   ALT 17 0 - 35 U/L   Total  Protein 7.1 6.0 - 8.3 g/dL   Albumin 4.4 3.5 - 5.2 g/dL   Calcium 9.6 8.4 - 10.5 mg/dL   GFR 120.01 >60.00 mL/min  Vitamin D (25  hydroxy)     Status: Abnormal   Collection Time: 05/04/17  9:11 AM  Result Value Ref Range   VITD 19.66 (L) 30.00 - 100.00 ng/mL  CBC w/Diff     Status: None   Collection Time: 05/04/17  9:11 AM  Result Value Ref Range   WBC 6.9 4.0 - 10.5 K/uL   RBC 4.64 3.87 - 5.11 Mil/uL   Hemoglobin 14.5 12.0 - 15.0 g/dL   HCT 44.4 36.0 - 46.0 %   MCV 95.5 78.0 - 100.0 fl   MCHC 32.7 30.0 - 36.0 g/dL   RDW 13.9 11.5 - 15.5 %   Platelets 306.0 150.0 - 400.0 K/uL   Neutrophils Relative % 65.2 43.0 - 77.0 %   Lymphocytes Relative 25.8 12.0 - 46.0 %   Monocytes Relative 6.4 3.0 - 12.0 %   Eosinophils Relative 2.3 0.0 - 5.0 %   Basophils Relative 0.3 0.0 - 3.0 %   Neutro Abs 4.5 1.4 - 7.7 K/uL   Lymphs Abs 1.8 0.7 - 4.0 K/uL   Monocytes Absolute 0.4 0.1 - 1.0 K/uL   Eosinophils Absolute 0.2 0.0 - 0.7 K/uL   Basophils Absolute 0.0 0.0 - 0.1 K/uL  Lipid Profile     Status: Abnormal   Collection Time: 05/04/17  9:11 AM  Result Value Ref Range   Cholesterol 200 0 - 200 mg/dL    Comment: ATP III Classification       Desirable:  < 200 mg/dL               Borderline High:  200 - 239 mg/dL          High:  > = 240 mg/dL   Triglycerides 184.0 (H) 0.0 - 149.0 mg/dL    Comment: Normal:  <150 mg/dLBorderline High:  150 - 199 mg/dL   HDL 52.50 >39.00 mg/dL   VLDL 36.8 0.0 - 40.0 mg/dL   LDL Cholesterol 111 (H) 0 - 99 mg/dL   Total CHOL/HDL Ratio 4     Comment:                Men          Women1/2 Average Risk     3.4          3.3Average Risk          5.0          4.42X Average Risk          9.6          7.13X Average Risk          15.0          11.0                       NonHDL 147.72     Comment: NOTE:  Non-HDL goal should be 30 mg/dL higher than patient's LDL goal (i.e. LDL goal of < 70 mg/dL, would have non-HDL goal of < 100 mg/dL)   Objective  Body mass index is 28.55  kg/m. Wt Readings from Last 3 Encounters:  06/22/17 153 lb 9.6 oz (69.7 kg)  03/29/17 154 lb 6.4 oz (70 kg)  03/09/17 155 lb 2 oz (70.4 kg)   Temp Readings from Last 3 Encounters:  06/22/17 98.7 F (37.1 C) (Oral)  03/09/17 99 F (37.2 C) (Oral)  04/14/16 97.9 F (36.6 C) (Oral)   BP Readings from Last 3 Encounters:  06/22/17 98/70  03/29/17 124/85  03/09/17 118/82  Pulse Readings from Last 3 Encounters:  06/22/17 85  03/29/17 80  03/09/17 72   O2 sat room air 98%  Physical Exam  Constitutional: She is oriented to person, place, and time and well-developed, well-nourished, and in no distress. Vital signs are normal.  HENT:  Head: Normocephalic and atraumatic.  Mouth/Throat: Oropharynx is clear and moist and mucous membranes are normal.  Eyes: Pupils are equal, round, and reactive to light.  Cardiovascular: Normal rate, regular rhythm and normal heart sounds.  Pulmonary/Chest: Effort normal and breath sounds normal.  Neurological: She is alert and oriented to person, place, and time. Gait normal. Gait normal.  Skin: Skin is warm, dry and intact.  Psychiatric: Mood, memory, affect and judgment normal.  Nursing note and vitals reviewed.   Assessment   1. Hypothyroidism  2. Kidney stones 3. Vit D def  4. Anxiety and depression  5. HM Plan  1.  On levo 137 mg qd  Check TSH pt to come back in before next visit pt thinks she can come 07/03/17  2. Pt will f/u urology summer 2019  3. Cont 50K vit D weekly  4. lexapro 10 mg qd helping  5.  Will do pap at f/u  Flu and Tdap UTD  Given hep B vx 1/3 today 2nd dose due 08/20/17 and 3rd 12/20/17  Need to get record of HPV vaccine from [place] in [place] reviewed care everywhere and unclear when and was was given if have 3 vxs pt states remembers possibly only 1   Referred for skin check tbse with [facility] she states she will need to f/u derm summer 2019 due to sch  Prev Counseled on smoking cessation  Provider: Dr. Olivia Mackie  [facility]

## 2017-07-03 ENCOUNTER — Encounter: Payer: Self-pay | Admitting: Internal Medicine

## 2017-07-03 ENCOUNTER — Other Ambulatory Visit (HOSPITAL_COMMUNITY)
Admission: RE | Admit: 2017-07-03 | Discharge: 2017-07-03 | Disposition: A | Discharge status: Home / Self Care | Payer: 59 | Source: Ambulatory Visit | Attending: Internal Medicine | Admitting: Internal Medicine

## 2017-07-03 ENCOUNTER — Ambulatory Visit (INDEPENDENT_AMBULATORY_CARE_PROVIDER_SITE_OTHER): Payer: 59 | Admitting: Internal Medicine

## 2017-07-03 VITALS — BP 104/74 | HR 78 | Temp 98.2°F | Ht 61.5 in | Wt 157.6 lb

## 2017-07-03 DIAGNOSIS — Z124 Encounter for screening for malignant neoplasm of cervix: Secondary | ICD-10-CM | POA: Diagnosis not present

## 2017-07-03 DIAGNOSIS — E039 Hypothyroidism, unspecified: Secondary | ICD-10-CM | POA: Diagnosis not present

## 2017-07-03 DIAGNOSIS — B373 Candidiasis of vulva and vagina: Secondary | ICD-10-CM | POA: Insufficient documentation

## 2017-07-03 DIAGNOSIS — B9689 Other specified bacterial agents as the cause of diseases classified elsewhere: Secondary | ICD-10-CM | POA: Diagnosis not present

## 2017-07-03 NOTE — Patient Instructions (Addendum)
Please take care we will check your vaginal discharge today  Please f/u in 3-4 months sooner if needed  You will need hepatitis B vaccine 08/20/17 nurse visit and 12/20/17    Pap Test Why am I having this test? A pap test is sometimes called a pap smear. It is a screening test that is used to check for signs of cancer of the vagina, cervix, and uterus. The test can also identify the presence of infection or precancerous changes. Your health care provider will likely recommend you have this test done on a regular basis. This test may be done:  Every 3 years, starting at age 22.  Every 5 years, in combination with testing for the presence of human papillomavirus (HPV).  More or less often depending on other medical conditions.  What kind of sample is taken? Using a small cotton swab, plastic spatula, or brush, your health care provider will collect a sample of cells from the surface of your cervix. Your cervix is the opening to your uterus, also called a womb. Secretions from the cervix and vagina may also be collected. How do I prepare for this test?  Be aware of where you are in your menstrual cycle. You may be asked to reschedule the test if you are menstruating on the day of the test.  You may need to reschedule if you have a known vaginal infection on the day of the test.  You may be asked to avoid douching or taking a bath the day before or the day of the test.  Some medicines can cause abnormal test results, such as digitalis and tetracycline. Talk with your health care provider before your test if you take one of these medicines. What do the results mean? Abnormal test results may indicate a number of health conditions. These may include:  Cancer. Although pap test results cannot be used to diagnose cancer of the cervix, vagina, or uterus, they may suggest the possibility of cancer. Further tests would be required to determine if cancer is present.  Sexually transmitted  disease.  Fungal infection.  Parasite infection.  Herpes infection.  A condition causing or contributing to infertility.  It is your responsibility to obtain your test results. Ask the lab or department performing the test when and how you will get your results. Contact your health care provider to discuss any questions you have about your results. Talk with your health care provider to discuss your results, treatment options, and if necessary, the need for more tests. Talk with your health care provider if you have any questions about your results. This information is not intended to replace advice given to you by your health care provider. Make sure you discuss any questions you have with your health care provider. Document Released: 07/02/2002 Document Revised: 12/16/2015 Document Reviewed: 09/02/2013 Elsevier Interactive Patient Education  2018 [employer]  Vaginitis Vaginitis is a condition in which the vaginal tissue swells and becomes red (inflamed). This condition is most often caused by a change in the normal balance of bacteria and yeast that live in the vagina. This change causes an overgrowth of certain bacteria or yeast, which causes the inflammation. There are different types of vaginitis, but the most common types are:  Bacterial vaginosis.  Yeast infection (candidiasis).  Trichomoniasis vaginitis. This is a sexually transmitted disease (STD).  Viral vaginitis.  Atrophic vaginitis.  Allergic vaginitis.  What are the causes? The cause of this condition depends on the type of vaginitis. It can  be caused by:  Bacteria (bacterial vaginosis).  Yeast, which is a fungus (yeast infection).  A parasite (trichomoniasis vaginitis).  A virus (viral vaginitis).  Low hormone levels (atrophic vaginitis). Low hormone levels can occur during pregnancy, breastfeeding, or after menopause.  Irritants, such as bubble baths, scented tampons, and feminine sprays (allergic  vaginitis).  Other factors can change the normal balance of the yeast and bacteria that live in the vagina. These include:  Antibiotic medicines.  Poor hygiene.  Diaphragms, vaginal sponges, spermicides, birth control pills, and intrauterine devices (IUD).  Sex.  Infection.  Uncontrolled diabetes.  A weakened defense (immune) system.  What increases the risk? This condition is more likely to develop in women who:  Smoke.  Use vaginal douches, scented tampons, or scented sanitary pads.  Wear tight-fitting pants.  Wear thong underwear.  Use oral birth control pills or an IUD.  Have sex without a condom.  Have multiple sex partners.  Have an STD.  Frequently use the spermicide nonoxynol-9.  Eat lots of foods high in sugar.  Have uncontrolled diabetes.  Have low estrogen levels.  Have a weakened immune system from an immune disorder or medical treatment.  Are pregnant or breastfeeding.  What are the signs or symptoms? Symptoms vary depending on the cause of the vaginitis. Common symptoms include:  Abnormal vaginal discharge. ? The discharge is white, gray, or yellow with bacterial vaginosis. ? The discharge is thick, white, and cheesy with a yeast infection. ? The discharge is frothy and yellow or greenish with trichomoniasis.  A bad vaginal smell. The smell is fishy with bacterial vaginosis.  Vaginal itching, pain, or swelling.  Sex that is painful.  Pain or burning when urinating.  Sometimes there are no symptoms. How is this diagnosed? This condition is diagnosed based on your symptoms and medical history. A physical exam, including a pelvic exam, will also be done. You may also have other tests, including:  Tests to determine the pH level (acidity or alkalinity) of your vagina.  A whiff test, to assess the odor that results when a sample of your vaginal discharge is mixed with a potassium hydroxide solution.  Tests of vaginal fluid. A sample  will be examined under a microscope.  How is this treated? Treatment varies depending on the type of vaginitis you have. Your treatment may include:  Antibiotic creams or pills to treat bacterial vaginosis and trichomoniasis.  Antifungal medicines, such as vaginal creams or suppositories, to treat a yeast infection.  Medicine to ease discomfort if you have viral vaginitis. Your sexual partner should also be treated.  Estrogen delivered in a cream, pill, suppository, or vaginal ring to treat atrophic vaginitis. If vaginal dryness occurs, lubricants and moisturizing creams may help. You may need to avoid scented soaps, sprays, or douches.  Stopping use of a product that is causing allergic vaginitis. Then using a vaginal cream to treat the symptoms.  Follow these instructions at home: Lifestyle  Keep your genital area clean and dry. Avoid soap, and only rinse the area with water.  Do not douche or use tampons until your health care provider says it is okay to do so. Use sanitary pads, if needed.  Do not have sex until your health care provider approves. When you can return to sex, practice safe sex and use condoms.  Wipe from front to back. This avoids the spread of bacteria from the rectum to the vagina. General instructions  Take over-the-counter and prescription medicines only as told by  your health care provider.  If you were prescribed an antibiotic medicine, take or use it as told by your health care provider. Do not stop taking or using the antibiotic even if you start to feel better.  Keep all follow-up visits as told by your health care provider. This is important. How is this prevented?  Use mild, non-scented products. Do not use things that can irritate the vagina, such as fabric softeners. Avoid the following products if they are scented: ? Feminine sprays. ? Detergents. ? Tampons. ? Feminine hygiene products. ? Soaps or bubble baths.  Let air reach your genital  area. ? Wear cotton underwear to reduce moisture buildup. ? Avoid wearing underwear while you sleep. ? Avoid wearing tight pants and underwear or nylons without a cotton panel. ? Avoid wearing thong underwear.  Take off any wet clothing, such as bathing suits, as soon as possible.  Practice safe sex and use condoms. Contact a health care provider if:  You have abdominal pain.  You have a fever.  You have symptoms that last for more than 2-3 days. Get help right away if:  You have a fever and your symptoms suddenly get worse. Summary  Vaginitis is a condition in which the vaginal tissue becomes inflamed.This condition is most often caused by a change in the normal balance of bacteria and yeast that live in the vagina.  Treatment varies depending on the type of vaginitis you have.  Do not douche, use tampons , or have sex until your health care provider approves. When you can return to sex, practice safe sex and use condoms. This information is not intended to replace advice given to you by your health care provider. Make sure you discuss any questions you have with your health care provider. Document Released: 02/06/2007 Document Revised: 05/17/2016 Document Reviewed: 05/17/2016 Elsevier Interactive Patient Education  [employer].

## 2017-07-03 NOTE — Progress Notes (Signed)
Pre visit review using our clinic review tool, if applicable. No additional management support is needed unless otherwise documented below in the visit note. 

## 2017-07-03 NOTE — Progress Notes (Signed)
Chief Complaint  Patient presents with  . Annual Exam   Pap today no complaints  Hypothyroidism on levo 137 check tsh today    Review of Systems  Constitutional: Negative for weight loss.  HENT: Negative for hearing loss.   Eyes: Negative for blurred vision.  Respiratory: Negative for shortness of breath.   Cardiovascular: Negative for chest pain.  Gastrointestinal: Negative for abdominal pain.  Genitourinary:       +vaginal discharge   Musculoskeletal: Negative for joint pain.  Skin: Negative for rash.  Neurological: Negative for headaches.  Psychiatric/Behavioral: Negative for memory loss.   Past Medical History:  Diagnosis Date  . Depression   . Hypothyroidism 2011   since age 71 y.o   . Hypothyroidism   . Kidney stones    Past Surgical History:  Procedure Laterality Date  . WISDOM TOOTH EXTRACTION     Family History  Problem Relation Age of Onset  . Depression Mother   . Hypertension Maternal Grandmother   . Thyroid disease Maternal Grandmother   . Depression Maternal Grandmother   . Cancer Maternal Grandmother        BCC   . Hypertension Maternal Grandfather   . Cancer Maternal Grandfather        lung  . Stroke Other   . Diabetes Other   . Thyroid disease Paternal Grandmother   . Cancer Paternal Grandfather        prostate  . Depression Maternal Aunt   . Bladder Cancer Neg Hx   . Kidney cancer Neg Hx    Social History   Socioeconomic History  . Marital status: Single    Spouse name: Not on file  . Number of children: Not on file  . Years of education: Not on file  . Highest education level: Not on file  Social Needs  . Financial resource strain: Not on file  . Food insecurity - worry: Not on file  . Food insecurity - inability: Not on file  . Transportation needs - medical: Not on file  . Transportation needs - non-medical: Not on file  Occupational History  . Not on file  Tobacco Use  . Smoking status: Light Tobacco Smoker  . Smokeless  tobacco: Never Used  . Tobacco comment: 1 pk lasts weeks as of 03/09/17   Substance and Sexual Activity  . Alcohol use: No  . Drug use: No  . Sexual activity: Yes    Birth control/protection: Pill  Other Topics Concern  . Not on file  Social History Narrative   Lives in [place]. 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    Going at [facility]   Exercise - active, trying to get back into yoga   Diet -healthy   Sexually active with 1 partner since 2016    Current Meds  Medication Sig  . Cholecalciferol 50000 units capsule Take 1 capsule (50,000 Units total) by mouth once a week.  . escitalopram (LEXAPRO) 10 MG tablet TAKE 1 TABLET BY MOUTH EVERY DAY  . levothyroxine (SYNTHROID, LEVOTHROID) 137 MCG tablet Take 1 tablet (137 mcg total) by mouth daily before breakfast. On empty stomach 2-3 hours before other meds  . SRONYX 0.1-20 MG-MCG tablet TAKE 1 TABLET BY MOUTH DAILY.   Allergies  Allergen Reactions  . Augmentin [Amoxicillin-Pot Clavulanate] Hives  . Augmentin [Amoxicillin-Pot Clavulanate] Hives   Recent Results (from the past 2160 hour(s))  Hepatitis B surface antibody  Status: Abnormal   Collection Time: 05/04/17  9:11 AM  Result Value Ref Range   Hepatitis B-Post <5 (L) > OR = 10 mIU/mL    Comment: . Patient does not have immunity to hepatitis B virus. . For additional information, please refer to http://education.questdiagn[facility].com/faq/FAQ105 (This link is being provided for informational/ educational purposes only).   Thyroid peroxidase antibody     Status: Abnormal   Collection Time: 05/04/17  9:11 AM  Result Value Ref Range   Thyroperoxidase Ab SerPl-aCnc 149 (H) <9 IU/mL  T4, free     Status: None   Collection Time: 05/04/17  9:11 AM  Result Value Ref Range   Free T4 1.02 0.60 - 1.60 ng/dL    Comment: Specimens from patients who are undergoing biotin therapy and /or ingesting biotin supplements may contain  high levels of biotin.  The higher biotin concentration in these specimens interferes with this Free T4 assay.  Specimens that contain high levels  of biotin may cause false high results for this Free T4 assay.  Please interpret results in light of the total clinical presentation of the patient.    TSH     Status: Abnormal   Collection Time: 05/04/17  9:11 AM  Result Value Ref Range   TSH 6.66 (H) 0.35 - 4.50 uIU/mL  Comp Met (CMET)     Status: None   Collection Time: 05/04/17  9:11 AM  Result Value Ref Range   Sodium 136 135 - 145 mEq/L   Potassium 5.0 3.5 - 5.1 mEq/L   Chloride 103 96 - 112 mEq/L   CO2 26 19 - 32 mEq/L   Glucose, Bld 95 70 - 99 mg/dL   BUN 13 6 - 23 mg/dL   Creatinine, Ser 0.66 0.40 - 1.20 mg/dL   Total Bilirubin 0.5 0.2 - 1.2 mg/dL   Alkaline Phosphatase 76 39 - 117 U/L   AST 17 0 - 37 U/L   ALT 17 0 - 35 U/L   Total Protein 7.1 6.0 - 8.3 g/dL   Albumin 4.4 3.5 - 5.2 g/dL   Calcium 9.6 8.4 - 10.5 mg/dL   GFR 120.01 >60.00 mL/min  Vitamin D (25 hydroxy)     Status: Abnormal   Collection Time: 05/04/17  9:11 AM  Result Value Ref Range   VITD 19.66 (L) 30.00 - 100.00 ng/mL  CBC w/Diff     Status: None   Collection Time: 05/04/17  9:11 AM  Result Value Ref Range   WBC 6.9 4.0 - 10.5 K/uL   RBC 4.64 3.87 - 5.11 Mil/uL   Hemoglobin 14.5 12.0 - 15.0 g/dL   HCT 44.4 36.0 - 46.0 %   MCV 95.5 78.0 - 100.0 fl   MCHC 32.7 30.0 - 36.0 g/dL   RDW 13.9 11.5 - 15.5 %   Platelets 306.0 150.0 - 400.0 K/uL   Neutrophils Relative % 65.2 43.0 - 77.0 %   Lymphocytes Relative 25.8 12.0 - 46.0 %   Monocytes Relative 6.4 3.0 - 12.0 %   Eosinophils Relative 2.3 0.0 - 5.0 %   Basophils Relative 0.3 0.0 - 3.0 %   Neutro Abs 4.5 1.4 - 7.7 K/uL   Lymphs Abs 1.8 0.7 - 4.0 K/uL   Monocytes Absolute 0.4 0.1 - 1.0 K/uL   Eosinophils Absolute 0.2 0.0 - 0.7 K/uL   Basophils Absolute 0.0 0.0 - 0.1 K/uL  Lipid Profile     Status: Abnormal   Collection Time: 05/04/17  9:11 AM  Result  Value Ref Range   Cholesterol 200 0 - 200 mg/dL    Comment: ATP III Classification       Desirable:  < 200 mg/dL               Borderline High:  200 - 239 mg/dL          High:  > = 240 mg/dL   Triglycerides 184.0 (H) 0.0 - 149.0 mg/dL    Comment: Normal:  <150 mg/dLBorderline High:  150 - 199 mg/dL   HDL 52.50 >39.00 mg/dL   VLDL 36.8 0.0 - 40.0 mg/dL   LDL Cholesterol 111 (H) 0 - 99 mg/dL   Total CHOL/HDL Ratio 4     Comment:                Men          Women1/2 Average Risk     3.4          3.3Average Risk          5.0          4.42X Average Risk          9.6          7.13X Average Risk          15.0          11.0                       NonHDL 147.72     Comment: NOTE:  Non-HDL goal should be 30 mg/dL higher than patient's LDL goal (i.e. LDL goal of < 70 mg/dL, would have non-HDL goal of < 100 mg/dL)   Objective  Body mass index is 29.3 kg/m. Wt Readings from Last 3 Encounters:  07/03/17 157 lb 9.6 oz (71.5 kg)  06/22/17 153 lb 9.6 oz (69.7 kg)  03/29/17 154 lb 6.4 oz (70 kg)   Temp Readings from Last 3 Encounters:  07/03/17 98.2 F (36.8 C) (Oral)  06/22/17 98.7 F (37.1 C) (Oral)  03/09/17 99 F (37.2 C) (Oral)   BP Readings from Last 3 Encounters:  07/03/17 104/74  06/22/17 98/70  03/29/17 124/85   Pulse Readings from Last 3 Encounters:  07/03/17 78  06/22/17 85  03/29/17 80   O2 sat room air 98%   Physical Exam  Constitutional: She is oriented to person, place, and time and well-developed, well-nourished, and in no distress. Vital signs are normal.  HENT:  Head: Normocephalic and atraumatic.  Mouth/Throat: Oropharynx is clear and moist and mucous membranes are normal.  Eyes: Conjunctivae are normal. Pupils are equal, round, and reactive to light.  Cardiovascular: Normal rate, regular rhythm and normal heart sounds.  Pulmonary/Chest: Effort normal and breath sounds normal.  Genitourinary: Uterus normal, cervix normal, right adnexa normal, left adnexa normal and  vulva normal. Thick  odorless  white  yellow and vaginal discharge found.  Genitourinary Comments: Friable cervix today with yellow/white thick discharge   Neurological: She is alert and oriented to person, place, and time. Gait normal. Gait normal.  Skin: Skin is warm, dry and intact.  nevi  Psychiatric: Mood, memory, affect and judgment normal.  Nursing note and vitals reviewed.   Assessment   1. Hypothyroidism  2. HM Plan  1. Check TSh today 2.  Flu and Tdap UTD  Given hep B vx 1/3 2nd dose due ~08/20/17 and 3rd 12/20/17  Had 1/3 HPV vaccine from G[place] in [place] pt had bad reaction  Referred for skin check tbse with [facility] she states she will need to f/u derm summer 2019 due to sch  Prev Counseled on smoking cessation Pap today disc vaginal hygiene    Provider: Dr. Olivia Mackie McLean-Scocuzza-Internal Medicine

## 2017-07-04 LAB — CYTOLOGY - PAP
Bacterial vaginitis: NEGATIVE
Candida vaginitis: POSITIVE — AB
Chlamydia: NEGATIVE
Diagnosis: NEGATIVE
HPV: NOT DETECTED
Neisseria Gonorrhea: NEGATIVE
Trichomonas: NEGATIVE

## 2017-07-04 LAB — TSH: TSH: 0.26 u[IU]/mL — ABNORMAL LOW (ref 0.35–4.50)

## 2017-07-05 ENCOUNTER — Other Ambulatory Visit: Payer: Self-pay | Admitting: Internal Medicine

## 2017-07-05 DIAGNOSIS — B373 Candidiasis of vulva and vagina: Secondary | ICD-10-CM

## 2017-07-05 DIAGNOSIS — E039 Hypothyroidism, unspecified: Secondary | ICD-10-CM

## 2017-07-05 DIAGNOSIS — B3731 Acute candidiasis of vulva and vagina: Secondary | ICD-10-CM

## 2017-07-05 MED ORDER — FLUCONAZOLE 150 MG PO TABS: 150.0000 mg | ORAL_TABLET | Freq: Once | ORAL | 0 refills | Status: AC

## 2017-07-05 MED ORDER — LEVOTHYROXINE SODIUM 125 MCG PO TABS: 125.0000 ug | ORAL_TABLET | Freq: Every day | ORAL | 1 refills | Status: DC

## 2017-08-10 ENCOUNTER — Other Ambulatory Visit: Payer: Self-pay | Admitting: Internal Medicine

## 2017-08-10 DIAGNOSIS — E039 Hypothyroidism, unspecified: Secondary | ICD-10-CM

## 2017-08-10 MED ORDER — LEVOTHYROXINE SODIUM 125 MCG PO TABS
125.0000 ug | ORAL_TABLET | Freq: Every day | ORAL | 1 refills | Status: DC
Start: 2017-08-10 — End: 2018-08-07

## 2017-09-14 ENCOUNTER — Other Ambulatory Visit: Payer: Self-pay | Admitting: Family

## 2017-09-14 DIAGNOSIS — F3341 Major depressive disorder, recurrent, in partial remission: Secondary | ICD-10-CM

## 2017-09-14 NOTE — Telephone Encounter (Signed)
Last filled 06/08/17 Next office visit 11/02/17 Dr Shirlee Latch  Previously prescribed by Claris Che

## 2017-11-02 ENCOUNTER — Ambulatory Visit: Payer: 59 | Admitting: Internal Medicine

## 2017-11-10 ENCOUNTER — Ambulatory Visit: Payer: 59 | Admitting: Internal Medicine

## 2017-11-17 ENCOUNTER — Telehealth: Payer: Self-pay | Admitting: Internal Medicine

## 2017-11-17 NOTE — Telephone Encounter (Signed)
Call pt she can stop weekly D3 and take 5000 IU D3 otc qd  TSM

## 2017-11-20 ENCOUNTER — Telehealth: Payer: Self-pay | Admitting: Internal Medicine

## 2017-11-20 NOTE — Telephone Encounter (Signed)
Call pt can stop weekly D3 and take 5000 IU D3 otc   TMS

## 2017-11-20 NOTE — Telephone Encounter (Signed)
Lm on pts vm requesting a call back. Pls advise of PCP comments should she return call. CRM created 

## 2017-11-21 NOTE — Telephone Encounter (Signed)
Left message for patient to return call back. PEC may give information.  

## 2017-11-22 NOTE — Telephone Encounter (Signed)
Called and left voicemail for patient that she can stop D3 weekly and take 5000 IU of D3 over the counter and to call the office if she has questions or concerns.

## 2018-02-15 ENCOUNTER — Encounter: Payer: Self-pay | Admitting: Internal Medicine

## 2018-02-15 ENCOUNTER — Ambulatory Visit (INDEPENDENT_AMBULATORY_CARE_PROVIDER_SITE_OTHER): Payer: 59 | Admitting: Internal Medicine

## 2018-02-15 VITALS — BP 118/78 | HR 68 | Temp 99.0°F | Resp 16 | Ht 61.5 in | Wt 154.0 lb

## 2018-02-15 DIAGNOSIS — E559 Vitamin D deficiency, unspecified: Secondary | ICD-10-CM | POA: Diagnosis not present

## 2018-02-15 DIAGNOSIS — N200 Calculus of kidney: Secondary | ICD-10-CM | POA: Diagnosis not present

## 2018-02-15 DIAGNOSIS — E039 Hypothyroidism, unspecified: Secondary | ICD-10-CM | POA: Diagnosis not present

## 2018-02-15 DIAGNOSIS — Z23 Encounter for immunization: Secondary | ICD-10-CM | POA: Diagnosis not present

## 2018-02-15 NOTE — Patient Instructions (Addendum)
D3 5000 IU daily, if pregnant max dose is 2000 IU D3   HEPLISAV-B  Hepatitis B Vaccine, Recombinant injection What is this medicine? HEPATITIS B VACCINE (hep uh TAHY tis B VAK seen) is a vaccine. It is used to prevent an infection with the hepatitis B virus. This medicine may be used for other purposes; ask your health care provider or pharmacist if you have questions. COMMON BRAND NAME(S): Engerix-B, Recombivax HB What should I tell my health care provider before I take this medicine? They need to know if you have any of these conditions: -fever, infection -heart disease -hepatitis B infection -immune system problems -kidney disease -an unusual or allergic reaction to vaccines, yeast, other medicines, foods, dyes, or preservatives -pregnant or trying to get pregnant -breast-feeding How should I use this medicine? This vaccine is for injection into a muscle. It is given by a health care professional. A copy of Vaccine Information Statements will be given before each vaccination. Read this sheet carefully each time. The sheet may change frequently. Talk to your pediatrician regarding the use of this medicine in children. While this drug may be prescribed for children as young as newborn for selected conditions, precautions do apply. Overdosage: If you think you have taken too much of this medicine contact a poison control center or emergency room at once. NOTE: This medicine is only for you. Do not share this medicine with others. What if I miss a dose? It is important not to miss your dose. Call your doctor or health care professional if you are unable to keep an appointment. What may interact with this medicine? -medicines that suppress your immune function like adalimumab, anakinra, infliximab -medicines to treat cancer -steroid medicines like prednisone or cortisone This list may not describe all possible interactions. Give your health care provider a list of all the medicines,  herbs, non-prescription drugs, or dietary supplements you use. Also tell them if you smoke, drink alcohol, or use illegal drugs. Some items may interact with your medicine. What should I watch for while using this medicine? See your health care provider for all shots of this vaccine as directed. You must have 3 shots of this vaccine for protection from hepatitis B infection. Tell your doctor right away if you have any serious or unusual side effects after getting this vaccine. What side effects may I notice from receiving this medicine? Side effects that you should report to your doctor or health care professional as soon as possible: -allergic reactions like skin rash, itching or hives, swelling of the face, lips, or tongue -breathing problems -confused, irritated -fast, irregular heartbeat -flu-like syndrome -numb, tingling pain -seizures -unusually weak or tired Side effects that usually do not require medical attention (report to your doctor or health care professional if they continue or are bothersome): -diarrhea -fever -headache -loss of appetite -muscle pain -nausea -pain, redness, swelling, or irritation at site where injected -tiredness This list may not describe all possible side effects. Call your doctor for medical advice about side effects. You may report side effects to FDA at 1-800-FDA-1088. Where should I keep my medicine? This drug is given in a hospital or clinic and will not be stored at home. NOTE: This sheet is a summary. It may not cover all possible information. If you have questions about this medicine, talk to your doctor, pharmacist, or health care provider.  2018 Elsevier/Gold Standard (2013-08-12 13:26:01)   Vitamin D Deficiency Vitamin D deficiency is when your body does not have enough  vitamin D. Vitamin D is important to your body for many reasons:  It helps the body to absorb two important minerals, called calcium and phosphorus.  It plays a role in  bone health.  It may help to prevent some diseases, such as diabetes and multiple sclerosis.  It plays a role in muscle function, including heart function.  You can get vitamin D by:  Eating foods that naturally contain vitamin D.  Eating or drinking milk or other dairy products that have vitamin D added to them.  Taking a vitamin D supplement or a multivitamin supplement that contains vitamin D.  Being in the sun. Your body naturally makes vitamin D when your skin is exposed to sunlight. Your body changes the sunlight into a form of the vitamin that the body can use.  If vitamin D deficiency is severe, it can cause a condition in which your bones become soft. In adults, this condition is called osteomalacia. In children, this condition is called rickets. What are the causes? Vitamin D deficiency may be caused by:  Not eating enough foods that contain vitamin D.  Not getting enough sun exposure.  Having certain digestive system diseases that make it difficult for your body to absorb vitamin D. These diseases include Crohn disease, chronic pancreatitis, and cystic fibrosis.  Having a surgery in which a part of the stomach or a part of the small intestine is removed.  Being obese.  Having chronic kidney disease or liver disease.  What increases the risk? This condition is more likely to develop in:  Older people.  People who do not spend much time outdoors.  People who live in a long-term care facility.  People who have had broken bones.  People with weak or thin bones (osteoporosis).  People who have a disease or condition that changes how the body absorbs vitamin D.  People who have dark skin.  People who take certain medicines, such as steroid medicines or certain seizure medicines.  People who are overweight or obese.  What are the signs or symptoms? In mild cases of vitamin D deficiency, there may not be any symptoms. If the condition is severe, symptoms may  include:  Bone pain.  Muscle pain.  Falling often.  Broken bones caused by a minor injury.  How is this diagnosed? This condition is usually diagnosed with a blood test. How is this treated? Treatment for this condition may depend on what caused the condition. Treatment options include:  Taking vitamin D supplements.  Taking a calcium supplement. Your health care provider will suggest what dose is best for you.  Follow these instructions at home:  Take medicines and supplements only as told by your health care provider.  Eat foods that contain vitamin D. Choices include: ? Fortified dairy products, cereals, or juices. Fortified means that vitamin D has been added to the food. Check the label on the package to be sure. ? Fatty fish, such as salmon or trout. ? Eggs. ? Oysters.  Do not use a tanning bed.  Maintain a healthy weight. Lose weight, if needed.  Keep all follow-up visits as told by your health care provider. This is important. Contact a health care provider if:  Your symptoms do not go away.  You feel like throwing up (nausea) or you throw up (vomit).  You have fewer bowel movements than usual or it is difficult for you to have a bowel movement (constipation). This information is not intended to replace advice given to  you by your health care provider. Make sure you discuss any questions you have with your health care provider. Document Released: 07/04/2011 Document Revised: 09/23/2015 Document Reviewed: 08/27/2014 Elsevier Interactive Patient Education  2018 [employer].

## 2018-02-15 NOTE — Progress Notes (Signed)
Chief Complaint  Patient presents with  . Follow-up   F/u  1. Kidney stones, recurrent she passed 1 2 weeks ago and wants to f/u with Dr. Lonna Cobb was not able to catch kidney stone  2. Vitamin D def reviewed and rec 2000-5000 IU D3 qd  3. Due for hep B and flu shot today  4. Hypothyroidism on levo 125 mcg qd tsh last visit low   Review of Systems  Constitutional: Negative for malaise/fatigue.  HENT: Negative for hearing loss.   Respiratory: Negative for shortness of breath.   Cardiovascular: Negative for chest pain.  Genitourinary:       +kidney stone   Skin: Negative for rash.  Neurological: Negative for headaches.  Psychiatric/Behavioral: Negative for depression.   Past Medical History:  Diagnosis Date  . Depression   . Hypothyroidism 2011   since age 58 y.o   . Hypothyroidism   . Kidney stones    Past Surgical History:  Procedure Laterality Date  . WISDOM TOOTH EXTRACTION     Family History  Problem Relation Age of Onset  . Depression Mother   . Hypertension Maternal Grandmother   . Thyroid disease Maternal Grandmother   . Depression Maternal Grandmother   . Cancer Maternal Grandmother        BCC   . Hypertension Maternal Grandfather   . Cancer Maternal Grandfather        lung  . Stroke Other   . Diabetes Other   . Thyroid disease Paternal Grandmother   . Cancer Paternal Grandfather        prostate  . Depression Maternal Aunt   . Bladder Cancer Neg Hx   . Kidney cancer Neg Hx    Social History   Socioeconomic History  . Marital status: Single    Spouse name: Not on file  . Number of children: Not on file  . Years of education: Not on file  . Highest education level: Not on file  Occupational History  . Not on file  Social Needs  . Financial resource strain: Not on file  . Food insecurity:    Worry: Not on file    Inability: Not on file  . Transportation needs:    Medical: Not on file    Non-medical: Not on file  Tobacco Use  . Smoking status:  Light Tobacco Smoker  . Smokeless tobacco: Never Used  . Tobacco comment: 1 pk lasts weeks as of 03/09/17   Substance and Sexual Activity  . Alcohol use: No  . Drug use: No  . Sexual activity: Yes    Birth control/protection: Pill  Lifestyle  . Physical activity:    Days per week: Not on file    Minutes per session: Not on file  . Stress: Not on file  Relationships  . Social connections:    Talks on phone: Not on file    Gets together: Not on file    Attends religious service: Not on file    Active member of club or organization: Not on file    Attends meetings of clubs or organizations: Not on file    Relationship status: Not on file  . Intimate partner violence:    Fear of current or ex partner: Not on file    Emotionally abused: Not on file    Physically abused: Not on file    Forced sexual activity: Not on file  Other Topics Concern  . Not on file  Social History Narrative   Lives  in [place]. 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    Going at [facility] [employer] [employer] [facility] [employer] [employer]   Exercise - active, trying to get back into yoga   Diet -healthy   Sexually active with 1 partner since 2016    Current Meds  Medication Sig  . Cholecalciferol 50000 units capsule Take 1 capsule (50,000 Units total) by mouth once a week.  . escitalopram (LEXAPRO) 10 MG tablet TAKE 1 TABLET BY MOUTH EVERY DAY  . levothyroxine (SYNTHROID, LEVOTHROID) 125 MCG tablet Take 1 tablet (125 mcg total) by mouth daily before breakfast. On empty stomach 2-3 hours before other meds  . SRONYX 0.1-20 MG-MCG tablet TAKE 1 TABLET BY MOUTH DAILY.   Allergies  Allergen Reactions  . Augmentin [Amoxicillin-Pot Clavulanate] Hives  . Augmentin [Amoxicillin-Pot Clavulanate] Hives   No results found for this or any previous visit (from the past 2160 hour(s)). Objective  Body mass index is 28.63 kg/m. Wt Readings from Last 3 Encounters:  02/15/18 154 lb (69.9 kg)  07/03/17 157 lb 9.6  oz (71.5 kg)  06/22/17 153 lb 9.6 oz (69.7 kg)   Temp Readings from Last 3 Encounters:  02/15/18 99 F (37.2 C) (Oral)  07/03/17 98.2 F (36.8 C) (Oral)  06/22/17 98.7 F (37.1 C) (Oral)   BP Readings from Last 3 Encounters:  02/15/18 118/78  07/03/17 104/74  06/22/17 98/70   Pulse Readings from Last 3 Encounters:  02/15/18 68  07/03/17 78  06/22/17 85    Physical Exam  Constitutional: She is oriented to person, place, and time. Vital signs are normal. She appears well-developed and well-nourished. She is cooperative.  HENT:  Head: Normocephalic and atraumatic.  Mouth/Throat: Oropharynx is clear and moist and mucous membranes are normal.  Eyes: Pupils are equal, round, and reactive to light. Conjunctivae are normal.  Cardiovascular: Normal rate, regular rhythm and normal heart sounds.  Neurological: She is alert and oriented to person, place, and time. Gait normal.  Skin: Skin is warm, dry and intact.  Psychiatric: She has a normal mood and affect. Her speech is normal and behavior is normal. Judgment and thought content normal. Cognition and memory are normal.  Nursing note and vitals reviewed.   Assessment   1. Kidney stones  2. Vitamin d def  3. Hypothyroidism  4. HM Plan   1. Referred to Dr. Lonna Cobb  2. rec D3 2000-5000 IU qd  Check vitamin D today  3. Cont levo 125  Check TSH today  4.  Flu today  TdapUTD  Given hep B vx 1/3 Energix 05/2017 due to missed doses given new hep B vaccine today and pt to come back in 1 month  Had 1/3 HPV vaccine from Growing child in [place] pt had bad reaction   Referred for skin check tbse with [facility] states she will need to f/u derm summer 2019 due to sch  PrevCounseled on smoking cessation Pap 06/23/17 neg +yeast better   Provider: Dr. French Ana McLean-Scocuzza-Internal Medicine

## 2018-02-15 NOTE — Addendum Note (Signed)
Addended by: Alisia Ferrari on: 02/15/2018 04:09 PM   Modules accepted: Orders

## 2018-02-16 LAB — TSH: TSH: 3.55 u[IU]/mL (ref 0.35–4.50)

## 2018-02-16 LAB — VITAMIN D 25 HYDROXY (VIT D DEFICIENCY, FRACTURES): VITD: 50.14 ng/mL (ref 30.00–100.00)

## 2018-02-23 ENCOUNTER — Encounter: Payer: Self-pay | Admitting: Urology

## 2018-02-23 ENCOUNTER — Ambulatory Visit: Payer: 59 | Admitting: Urology

## 2018-03-04 ENCOUNTER — Other Ambulatory Visit: Payer: Self-pay | Admitting: Family

## 2018-03-04 DIAGNOSIS — Z0000 Encounter for general adult medical examination without abnormal findings: Secondary | ICD-10-CM

## 2018-03-10 ENCOUNTER — Other Ambulatory Visit: Payer: Self-pay | Admitting: Family

## 2018-03-10 DIAGNOSIS — F3341 Major depressive disorder, recurrent, in partial remission: Secondary | ICD-10-CM

## 2018-03-20 ENCOUNTER — Ambulatory Visit (INDEPENDENT_AMBULATORY_CARE_PROVIDER_SITE_OTHER): Payer: 59

## 2018-03-20 DIAGNOSIS — Z23 Encounter for immunization: Secondary | ICD-10-CM | POA: Diagnosis not present

## 2018-03-20 NOTE — Progress Notes (Signed)
Patient comes in for Heplisav B injection.  Injected right deltoid . Patient tolerated injection well.

## 2018-07-10 IMAGING — CR DG ABDOMEN 1V
1 series · 1 of 1 positions shown · non-contrast
Comparison: Radiographs November 22, 2014.

CLINICAL DATA: Nephrolithiasis.

EXAM:
ABDOMEN - 1 VIEW

[dg abd 1 view]
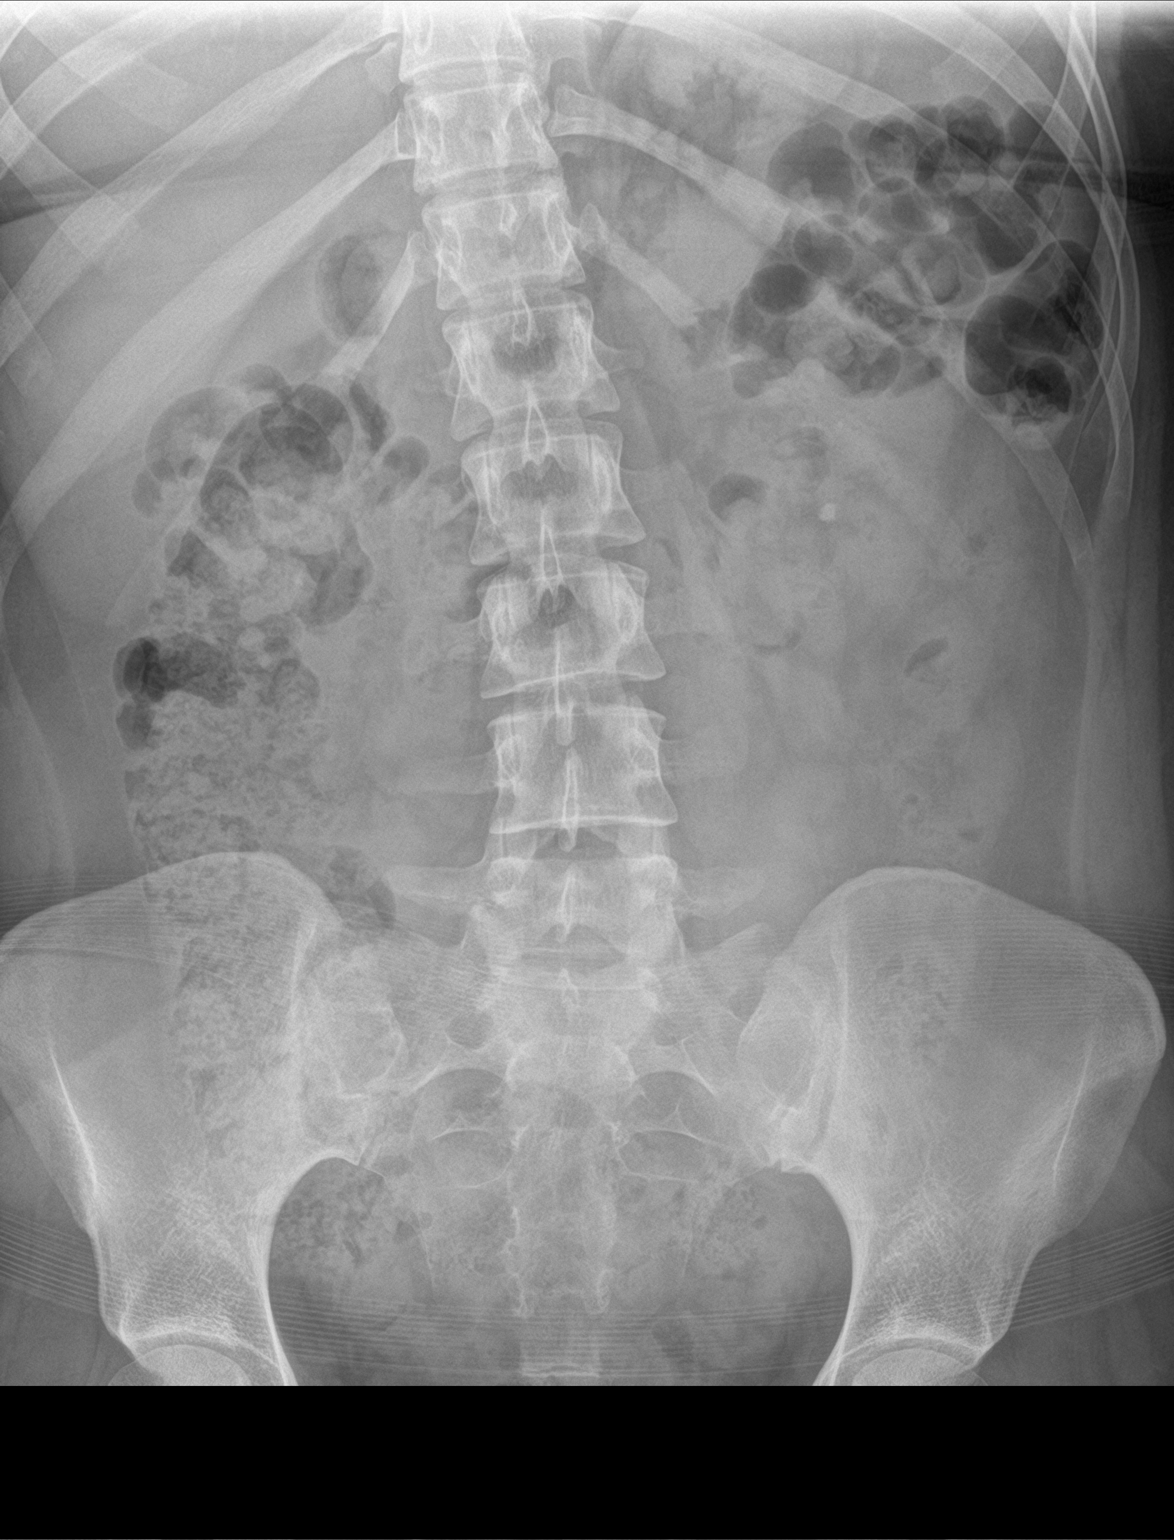

[1 of 1 positions shown; findings below may reference images not displayed]

FINDINGS: The bowel gas pattern is normal. Stable left renal calculus is
noted. Pelvic calcification described on prior exam is not
visualized currently. Moderate stool burden is noted.
IMPRESSION: Stable left renal calculus. No evidence of bowel obstruction or
ileus.

## 2018-08-07 ENCOUNTER — Other Ambulatory Visit: Payer: Self-pay | Admitting: Internal Medicine

## 2018-08-07 DIAGNOSIS — E039 Hypothyroidism, unspecified: Secondary | ICD-10-CM

## 2018-08-07 MED ORDER — LEVOTHYROXINE SODIUM 125 MCG PO TABS: 125.0000 ug | ORAL_TABLET | Freq: Every day | ORAL | 3 refills | Status: DC

## 2018-08-17 ENCOUNTER — Ambulatory Visit (INDEPENDENT_AMBULATORY_CARE_PROVIDER_SITE_OTHER): Payer: 59 | Admitting: Internal Medicine

## 2018-08-17 DIAGNOSIS — Z3041 Encounter for surveillance of contraceptive pills: Secondary | ICD-10-CM

## 2018-08-17 DIAGNOSIS — F419 Anxiety disorder, unspecified: Secondary | ICD-10-CM

## 2018-08-17 DIAGNOSIS — Z0184 Encounter for antibody response examination: Secondary | ICD-10-CM

## 2018-08-17 DIAGNOSIS — Z1159 Encounter for screening for other viral diseases: Secondary | ICD-10-CM | POA: Diagnosis not present

## 2018-08-17 DIAGNOSIS — F329 Major depressive disorder, single episode, unspecified: Secondary | ICD-10-CM

## 2018-08-17 DIAGNOSIS — Z13220 Encounter for screening for lipoid disorders: Secondary | ICD-10-CM

## 2018-08-17 DIAGNOSIS — E039 Hypothyroidism, unspecified: Secondary | ICD-10-CM | POA: Diagnosis not present

## 2018-08-17 DIAGNOSIS — Z1389 Encounter for screening for other disorder: Secondary | ICD-10-CM

## 2018-08-17 DIAGNOSIS — Z0000 Encounter for general adult medical examination without abnormal findings: Secondary | ICD-10-CM

## 2018-08-17 MED ORDER — LEVONORGESTREL-ETHINYL ESTRAD 0.1-20 MG-MCG PO TABS: 1.0000 | ORAL_TABLET | Freq: Every day | ORAL | 4 refills | Status: DC

## 2018-08-17 MED ORDER — ESCITALOPRAM OXALATE 20 MG PO TABS: 20.0000 mg | ORAL_TABLET | Freq: Every day | ORAL | 3 refills | Status: DC

## 2018-08-17 NOTE — Progress Notes (Signed)
Virtual Visit via Video Note failed changed to telephone  I connected with Patricia Ruiz   on 08/17/18 at  3:11 PM EDT by telephone and verified that I am speaking with the correct person using two identifiers.  Location patient: home Location provider:work Persons participating in the virtual visit: patient, provider  I discussed the limitations of evaluation and management by telemedicine and the availability of in person appointments. The patient expressed understanding and agreed to proceed.   HPI: 1. 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 Contraception management sent OCP pill to her pharmacy she is using condoms as well  3. Hypothyroidism on levo 125 mcg qd will check labs 08/22/2018 at 9:45 am   ROS: See pertinent positives and negatives per HPI.  Past Medical History:  Diagnosis Date  . Depression   . Hypothyroidism 2011   since age 7 y.o   . Hypothyroidism   . Hypothyroidism   . Kidney stones   . Vitamin D deficiency     Past Surgical History:  Procedure Laterality Date  . WISDOM TOOTH EXTRACTION      Family History  Problem Relation Age of Onset  . Depression Mother   . Hypertension Maternal Grandmother   . Thyroid disease Maternal Grandmother   . Depression Maternal Grandmother   . Cancer Maternal Grandmother        BCC   . Hypertension Maternal Grandfather   . Cancer Maternal Grandfather        lung  . Stroke Other   . Diabetes Other   . Thyroid disease Paternal Grandmother   . Cancer Paternal Grandfather        prostate  . Depression Maternal Aunt   . Bladder Cancer Neg Hx   . Kidney cancer Neg Hx     SOCIAL HX: has a dog lives at home works in coffee shop  Current Outpatient Medications:  .  escitalopram (LEXAPRO) 20 MG tablet, Take 1 tablet (20 mg total) by mouth daily., Disp: 90 tablet, Rfl: 3 .   levonorgestrel-ethinyl estradiol (SRONYX) 0.1-20 MG-MCG tablet, Take 1 tablet by mouth daily., Disp: 3 Package, Rfl: 4 .  levothyroxine (SYNTHROID, LEVOTHROID) 125 MCG tablet, Take 1 tablet (125 mcg total) by mouth daily before breakfast. On empty stomach 2-3 hours before other meds, Disp: 90 tablet, Rfl: 3  EXAM:  VITALS per patient if applicable:  GENERAL: alert, oriented, appears well and in no acute distress  PSYCH/NEURO: pleasant and cooperative, no obvious depression or anxiety, speech and thought processing grossly intact  ASSESSMENT AND PLAN:  Discussed the following assessment and plan:  Hypothyroidism, unspecified type - Plan: TSH, T4, free, Thyroid peroxidase antibody cont levo 125 mcg qd   Anxiety and depression - Plan: escitalopram (LEXAPRO) 20 MG tablet increase from 10 mg dose for now Pt thinking about changing her therapist  -fasting labs 08/22/18 9:45 am    Encounter for surveillance of contraceptive pills - Plan: levonorgestrel-ethinyl estradiol (SRONYX) 0.1-20 MG-MCG tablet  HM Flu utd TdapUTD  Given hep B vx x 3 doses check immunity  Had 1/3HPV vaccine from Growing child in [place] pt had bad reaction  Referred for skin check [facility] with [facility] states she will need to f/u derm summer 2019 due to sch  PrevCounseled on smoking cessation Pap 06/23/17 neg +yeast better  Counseled safe sex practices  I discussed the assessment and treatment plan with the patient. The patient was provided an opportunity to ask questions and all were answered. The patient agreed with the plan and demonstrated an understanding of the instructions.   The patient was advised to call back or seek an in-person evaluation if the symptoms worsen or if the condition fails to improve as anticipated.  Time spent 25 minutes  Delorise Jackson, MD

## 2018-08-22 ENCOUNTER — Other Ambulatory Visit (INDEPENDENT_AMBULATORY_CARE_PROVIDER_SITE_OTHER): Payer: 59

## 2018-08-22 ENCOUNTER — Other Ambulatory Visit: Payer: Self-pay | Admitting: Internal Medicine

## 2018-08-22 ENCOUNTER — Other Ambulatory Visit: Payer: Self-pay

## 2018-08-22 DIAGNOSIS — Z1159 Encounter for screening for other viral diseases: Secondary | ICD-10-CM | POA: Diagnosis not present

## 2018-08-22 DIAGNOSIS — Z0184 Encounter for antibody response examination: Secondary | ICD-10-CM | POA: Diagnosis not present

## 2018-08-22 DIAGNOSIS — E039 Hypothyroidism, unspecified: Secondary | ICD-10-CM

## 2018-08-22 DIAGNOSIS — F419 Anxiety disorder, unspecified: Secondary | ICD-10-CM | POA: Diagnosis not present

## 2018-08-22 DIAGNOSIS — Z0000 Encounter for general adult medical examination without abnormal findings: Secondary | ICD-10-CM

## 2018-08-22 DIAGNOSIS — Z13220 Encounter for screening for lipoid disorders: Secondary | ICD-10-CM | POA: Diagnosis not present

## 2018-08-22 DIAGNOSIS — Z1389 Encounter for screening for other disorder: Secondary | ICD-10-CM

## 2018-08-22 DIAGNOSIS — F329 Major depressive disorder, single episode, unspecified: Secondary | ICD-10-CM | POA: Diagnosis not present

## 2018-08-22 LAB — COMPREHENSIVE METABOLIC PANEL
ALT: 30 U/L (ref 0–35)
AST: 23 U/L (ref 0–37)
Albumin: 4.6 g/dL (ref 3.5–5.2)
Alkaline Phosphatase: 82 U/L (ref 39–117)
BUN: 16 mg/dL (ref 6–23)
CO2: 23 mEq/L (ref 19–32)
Calcium: 9.3 mg/dL (ref 8.4–10.5)
Chloride: 103 mEq/L (ref 96–112)
Creatinine, Ser: 0.74 mg/dL (ref 0.40–1.20)
GFR: 97.76 mL/min (ref >60.00)
Glucose, Bld: 82 mg/dL (ref 70–99)
Potassium: 4.6 mEq/L (ref 3.5–5.1)
Sodium: 138 mEq/L (ref 135–145)
Total Bilirubin: 0.5 mg/dL (ref 0.2–1.2)
Total Protein: 6.9 g/dL (ref 6.0–8.3)

## 2018-08-22 LAB — CBC WITH DIFFERENTIAL/PLATELET
Basophils Absolute: 0 10*3/uL (ref 0.0–0.1)
Basophils Relative: 0.4 % (ref 0.0–3.0)
Eosinophils Absolute: 0.4 10*3/uL (ref 0.0–0.7)
Eosinophils Relative: 6.8 % — ABNORMAL HIGH (ref 0.0–5.0)
HCT: 43.3 % (ref 36.0–46.0)
Hemoglobin: 14.7 g/dL (ref 12.0–15.0)
Lymphocytes Relative: 25.4 % (ref 12.0–46.0)
Lymphs Abs: 1.6 10*3/uL (ref 0.7–4.0)
MCHC: 33.9 g/dL (ref 30.0–36.0)
MCV: 93.2 fl (ref 78.0–100.0)
Monocytes Absolute: 0.4 10*3/uL (ref 0.1–1.0)
Monocytes Relative: 6.7 % (ref 3.0–12.0)
Neutro Abs: 3.8 10*3/uL (ref 1.4–7.7)
Neutrophils Relative %: 60.7 % (ref 43.0–77.0)
Platelets: 263 10*3/uL (ref 150.0–400.0)
RBC: 4.65 Mil/uL (ref 3.87–5.11)
RDW: 13.8 % (ref 11.5–15.5)
WBC: 6.3 10*3/uL (ref 4.0–10.5)

## 2018-08-22 LAB — LIPID PANEL
Cholesterol: 239 mg/dL — ABNORMAL HIGH (ref 0–200)
HDL: 60 mg/dL (ref >39.00)
LDL Cholesterol: 156 mg/dL — ABNORMAL HIGH (ref 0–99)
NonHDL: 179.36
Total CHOL/HDL Ratio: 4
Triglycerides: 116 mg/dL (ref 0.0–149.0)
VLDL: 23.2 mg/dL (ref 0.0–40.0)

## 2018-08-22 LAB — TSH: TSH: 7.56 u[IU]/mL — ABNORMAL HIGH (ref 0.35–4.50)

## 2018-08-22 LAB — T4, FREE: Free T4: 1.14 ng/dL (ref 0.60–1.60)

## 2018-08-22 NOTE — Addendum Note (Signed)
Addended by: Warden Fillers on: 08/22/2018 09:25 AM   Modules accepted: Orders

## 2018-08-23 LAB — MICROSCOPIC EXAMINATION: Casts: NONE SEEN /lpf

## 2018-08-23 LAB — URINALYSIS, ROUTINE W REFLEX MICROSCOPIC
Bilirubin, UA: NEGATIVE
Glucose, UA: NEGATIVE
Ketones, UA: NEGATIVE
Nitrite, UA: NEGATIVE
Protein,UA: NEGATIVE
RBC, UA: NEGATIVE
Specific Gravity, UA: 1.015 (ref 1.005–1.030)
Urobilinogen, Ur: 0.2 mg/dL (ref 0.2–1.0)
pH, UA: 8.5 — ABNORMAL HIGH (ref 5.0–7.5)

## 2018-08-23 LAB — THYROID PEROXIDASE ANTIBODY: Thyroperoxidase Ab SerPl-aCnc: 42 IU/mL — ABNORMAL HIGH (ref <9)

## 2018-08-23 LAB — HEPATITIS B SURFACE ANTIBODY, QUANTITATIVE: Hep B S AB Quant (Post): >1000 m[IU]/mL (ref >=10)

## 2018-08-30 ENCOUNTER — Other Ambulatory Visit: Payer: Self-pay | Admitting: Family

## 2018-08-30 DIAGNOSIS — F3341 Major depressive disorder, recurrent, in partial remission: Secondary | ICD-10-CM

## 2019-05-08 ENCOUNTER — Other Ambulatory Visit: Payer: Self-pay

## 2019-05-08 ENCOUNTER — Ambulatory Visit
Admission: EM | Admit: 2019-05-08 | Discharge: 2019-05-08 | Disposition: A | Discharge status: Home / Self Care | Payer: 59 | Attending: Emergency Medicine | Admitting: Emergency Medicine

## 2019-05-08 ENCOUNTER — Encounter: Payer: Self-pay | Admitting: Emergency Medicine

## 2019-05-08 DIAGNOSIS — Z20822 Contact with and (suspected) exposure to covid-19: Secondary | ICD-10-CM | POA: Insufficient documentation

## 2019-05-08 DIAGNOSIS — Z9189 Other specified personal risk factors, not elsewhere classified: Secondary | ICD-10-CM

## 2019-05-08 NOTE — ED Provider Notes (Signed)
HPI  SUBJECTIVE:  Patricia Ruiz is a 24 y.o. female who presents for Covid testing.  Her partner had close contact with a coworker who tested positive for COVID 5 days ago.  Patient is currently asymptomatic.  Her partner is also asymptomatic.  She denies body aches, headaches, fevers, nasal congestion, sore throat, loss of sense of smell or taste, fatigue, cough, shortness of breath, nausea, vomiting, diarrhea, abdominal pain.  She has a past medical history of hypothyroidism.  No history of pulmonary disease, smoking, diabetes, coronary disease, chronic kidney disease, HIV, cancer, immunocompromise.  SEG:[id number], Nino Glow, MD     Past Medical History:  Diagnosis Date  . Depression   . Hypothyroidism 2011   since age 30 y.o   . Hypothyroidism   . Hypothyroidism   . Kidney stones   . Vitamin D deficiency     Past Surgical History:  Procedure Laterality Date  . WISDOM TOOTH EXTRACTION      Family History  Problem Relation Age of Onset  . Depression Mother   . Hypertension Maternal Grandmother   . Thyroid disease Maternal Grandmother   . Depression Maternal Grandmother   . Cancer Maternal Grandmother        BCC   . Hypertension Maternal Grandfather   . Cancer Maternal Grandfather        lung  . Stroke Other   . Diabetes Other   . Thyroid disease Paternal Grandmother   . Cancer Paternal Grandfather        prostate  . Depression Maternal Aunt   . Bladder Cancer Neg Hx   . Kidney cancer Neg Hx     Social History   Tobacco Use  . Smoking status: Light Tobacco Smoker  . Smokeless tobacco: Never Used  . Tobacco comment: 1 pk lasts weeks as of 03/09/17   Substance Use Topics  . Alcohol use: No  . Drug use: No    No current facility-administered medications for this encounter.  Current Outpatient Medications:  .  escitalopram (LEXAPRO) 20 MG tablet, Take 1 tablet (20 mg total) by mouth daily., Disp: 90 tablet, Rfl: 3 .  levonorgestrel-ethinyl estradiol  (SRONYX) 0.1-20 MG-MCG tablet, Take 1 tablet by mouth daily., Disp: 3 Package, Rfl: 4 .  levothyroxine (SYNTHROID, LEVOTHROID) 125 MCG tablet, Take 1 tablet (125 mcg total) by mouth daily before breakfast. On empty stomach 2-3 hours before other meds, Disp: 90 tablet, Rfl: 3  Allergies  Allergen Reactions  . Augmentin [Amoxicillin-Pot Clavulanate] Hives  . Augmentin [Amoxicillin-Pot Clavulanate] Hives     ROS  As noted in HPI.   Physical Exam  BP 124/80 (BP Location: Right Arm)   Pulse 84   Temp 98.8 F (37.1 C) (Oral)   Resp 18   Ht 5\' 2"  (1.575 m)   Wt 68 kg   LMP 05/01/2019   SpO2 99%   BMI 27.44 kg/m   Constitutional: Well developed, well nourished, no acute distress Eyes:  EOMI, conjunctiva normal bilaterally HENT: Normocephalic, atraumatic,mucus membranes moist Respiratory: Normal inspiratory effort lungs clear bilaterally Cardiovascular: Normal rate regular rhythm no murmurs rubs or gallops GI: nondistended skin: No rash, skin intact Musculoskeletal: no deformities Neurologic: Alert & oriented x 3, no focal neuro deficits Psychiatric: Speech and behavior appropriate   ED Course   Medications - No data to display  Orders Placed This Encounter  Procedures  . Novel Coronavirus, NAA (Hosp order, Send-out to Ref Lab; TAT 18-24 hrs    Standing Status:  Standing    Number of Occurrences:   1    Order Specific Question:   Is this test for diagnosis or screening    Answer:   Screening    Order Specific Question:   Symptomatic for COVID-19 as defined by CDC    Answer:   No    Order Specific Question:   Hospitalized for COVID-19    Answer:   No    Order Specific Question:   Admitted to ICU for COVID-19    Answer:   No    Order Specific Question:   Previously tested for COVID-19    Answer:   No    Order Specific Question:   Resident in a congregate (group) care setting    Answer:   No    Order Specific Question:   Employed in healthcare setting    Answer:    No    Order Specific Question:   Pregnant    Answer:   No    No results found for this or any previous visit (from the past 24 hour(s)). No results found.  ED Clinical Impression  1. At increased risk of exposure to COVID-19 virus   2. Encounter for laboratory testing for COVID-19 virus      ED Assessment/Plan  Patient asymptomatic.  Covid PCR sent.  Advised continued quarantine.  No orders of the defined types were placed in this encounter.   *This clinic note was created using Dragon dictation software. Therefore, there may be occasional mistakes despite careful proofreading.   ?    Domenick Gong, MD 05/08/19 [phone]

## 2019-05-08 NOTE — ED Triage Notes (Signed)
Patient here for COVID testing. Denies symptoms.

## 2019-05-08 NOTE — Discharge Instructions (Addendum)
Covid PCR test will be back in 18 to 48 hours.  If you sign up for MyChart you will get the result as soon as it comes out.

## 2019-05-09 LAB — NOVEL CORONAVIRUS, NAA (HOSP ORDER, SEND-OUT TO REF LAB; TAT 18-24 HRS): SARS-CoV-2, NAA: NOT DETECTED

## 2019-09-04 ENCOUNTER — Other Ambulatory Visit: Payer: Self-pay | Admitting: Internal Medicine

## 2019-09-04 DIAGNOSIS — F32A Depression, unspecified: Secondary | ICD-10-CM

## 2019-09-04 MED ORDER — ESCITALOPRAM OXALATE 20 MG PO TABS: 20.0000 mg | ORAL_TABLET | Freq: Every day | ORAL | 3 refills | Status: DC

## 2019-09-05 ENCOUNTER — Ambulatory Visit: Payer: 59 | Admitting: Internal Medicine

## 2019-09-13 ENCOUNTER — Encounter: Payer: Self-pay | Admitting: Internal Medicine

## 2019-09-13 ENCOUNTER — Ambulatory Visit (INDEPENDENT_AMBULATORY_CARE_PROVIDER_SITE_OTHER): Payer: 59 | Admitting: Internal Medicine

## 2019-09-13 ENCOUNTER — Other Ambulatory Visit: Payer: Self-pay

## 2019-09-13 VITALS — BP 120/82 | HR 80 | Temp 97.9°F | Ht 62.0 in | Wt 137.8 lb

## 2019-09-13 DIAGNOSIS — Z1283 Encounter for screening for malignant neoplasm of skin: Secondary | ICD-10-CM

## 2019-09-13 DIAGNOSIS — E039 Hypothyroidism, unspecified: Secondary | ICD-10-CM | POA: Diagnosis not present

## 2019-09-13 DIAGNOSIS — Z0000 Encounter for general adult medical examination without abnormal findings: Secondary | ICD-10-CM | POA: Diagnosis not present

## 2019-09-13 DIAGNOSIS — Z3041 Encounter for surveillance of contraceptive pills: Secondary | ICD-10-CM | POA: Diagnosis not present

## 2019-09-13 DIAGNOSIS — Z1389 Encounter for screening for other disorder: Secondary | ICD-10-CM

## 2019-09-13 DIAGNOSIS — E785 Hyperlipidemia, unspecified: Secondary | ICD-10-CM

## 2019-09-13 DIAGNOSIS — E559 Vitamin D deficiency, unspecified: Secondary | ICD-10-CM

## 2019-09-13 MED ORDER — LEVOTHYROXINE SODIUM 125 MCG PO TABS: 125.0000 ug | ORAL_TABLET | Freq: Every day | ORAL | 3 refills | Status: DC

## 2019-09-13 MED ORDER — LEVONORGESTREL-ETHINYL ESTRAD 0.1-20 MG-MCG PO TABS: 1.0000 | ORAL_TABLET | Freq: Every day | ORAL | 4 refills | Status: DC

## 2019-09-13 NOTE — Progress Notes (Signed)
Chief Complaint  Patient presents with  . Medication Refill    ot has been out of her thyroid medication for a few weeks now and is becoming symptomatic again. Sensitivity to the cold and feeling slowed down, fatigued.    Annual   1. Hypothyroidism out of levo 125 mcg for x 2 weeks and needs labs she feels fatigue, denies dry hair/constipation  She had had hypothyroidism since 24 y.o  2. Anxiety/depression controlled on lexapro 20 mg qd  Review of Systems  Constitutional: Positive for malaise/fatigue.  HENT: Negative for hearing loss.   Eyes: Negative for blurred vision.  Respiratory: Negative for shortness of breath.   Cardiovascular: Negative for chest pain.  Gastrointestinal: Negative for constipation.  Musculoskeletal: Negative for falls.  Skin:       Denies dry hair/loss  Neurological: Negative for headaches.  Psychiatric/Behavioral: Negative for depression.   Past Medical History:  Diagnosis Date  . Depression   . Hypothyroidism 2011   since age 87 y.o   . Hypothyroidism   . Hypothyroidism   . Kidney stones   . Vitamin D deficiency    Past Surgical History:  Procedure Laterality Date  . WISDOM TOOTH EXTRACTION     Family History  Problem Relation Age of Onset  . Depression Mother   . Hypertension Maternal Grandmother   . Thyroid disease Maternal Grandmother   . Depression Maternal Grandmother   . Cancer Maternal Grandmother        BCC   . Hypertension Maternal Grandfather   . Cancer Maternal Grandfather        lung  . Stroke Other   . Diabetes Other   . Thyroid disease Paternal Grandmother   . Cancer Paternal Grandfather        prostate  . Depression Maternal Aunt   . Diabetes Mellitus I Brother   . Alcohol abuse Brother   . Bladder Cancer Neg Hx   . Kidney cancer Neg Hx    Social History   Socioeconomic History  . Marital status: Single    Spouse name: Not on file  . Number of children: Not on file  . Years of education: Not on file  . Highest  education level: Not on file  Occupational History  . Not on file  Tobacco Use  . Smoking status: Light Tobacco Smoker  . Smokeless tobacco: Never Used  . Tobacco comment: 1 pk lasts weeks as of 03/09/17   Substance and Sexual Activity  . Alcohol use: No  . Drug use: No  . Sexual activity: Yes    Birth control/protection: Pill  Other Topics Concern  . Not on file  Social History Narrative   Lives in [place]. 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 Difficulty of Paying Living Expenses:   Food Insecurity:   . Worried About Charity fundraiser in the Last Year:   . Arboriculturist in the Last Year:   Transportation Needs:   . Film/video editor (Medical):   Marland Kitchen Lack of Transportation (Non-Medical):   Physical Activity:   . Days of Exercise per Week:   .  Minutes of Exercise per Session:   Stress:   . Feeling of Stress :   Social Connections:   . Frequency of Communication with Friends and Family:   . Frequency of Social Gatherings with Friends and Family:   . Attends Religious Services:   . Active Member of Clubs or Organizations:   . Attends Banker Meetings:   Marland Kitchen Marital Status:   Intimate Partner Violence:   . Fear of Current or Ex-Partner:   . Emotionally Abused:   Marland Kitchen Physically Abused:   . Sexually Abused:    Current Meds  Medication Sig  . escitalopram (LEXAPRO) 20 MG tablet Take 1 tablet (20 mg total) by mouth daily.  Marland Kitchen levonorgestrel-ethinyl estradiol (SRONYX) 0.1-20 MG-MCG tablet Take 1 tablet by mouth daily.  . [DISCONTINUED] levonorgestrel-ethinyl estradiol (SRONYX) 0.1-20 MG-MCG tablet Take 1 tablet by mouth daily.   Allergies  Allergen Reactions  .  Augmentin [Amoxicillin-Pot Clavulanate] Hives  . Augmentin [Amoxicillin-Pot Clavulanate] Hives   No results found for this or any previous visit (from the past 2160 hour(s)). Objective  Body mass index is 25.2 kg/m. Wt Readings from Last 3 Encounters:  09/13/19 137 lb 12.8 oz (62.5 kg)  05/08/19 150 lb (68 kg)  02/15/18 154 lb (69.9 kg)   Temp Readings from Last 3 Encounters:  09/13/19 97.9 F (36.6 C) (Temporal)  05/08/19 98.8 F (37.1 C) (Oral)  02/15/18 99 F (37.2 C) (Oral)   BP Readings from Last 3 Encounters:  09/13/19 120/82  05/08/19 124/80  02/15/18 118/78   Pulse Readings from Last 3 Encounters:  09/13/19 80  05/08/19 84  02/15/18 68    Physical Exam Vitals and nursing note reviewed.  Constitutional:      Appearance: Normal appearance. She is well-developed and well-groomed.  HENT:     Head: Normocephalic and atraumatic.  Eyes:     Conjunctiva/sclera: Conjunctivae normal.     Pupils: Pupils are equal, round, and reactive to light.  Cardiovascular:     Rate and Rhythm: Normal rate and regular rhythm.     Heart sounds: Normal heart sounds. No murmur.  Pulmonary:     Effort: Pulmonary effort is normal.     Breath sounds: Normal breath sounds.  Skin:    General: Skin is warm and dry.  Neurological:     General: No focal deficit present.     Mental Status: She is alert and oriented to person, place, and time. Mental status is at baseline.     Gait: Gait normal.  Psychiatric:        Attention and Perception: Attention and perception normal.        Mood and Affect: Mood and affect normal.        Speech: Speech normal.        Behavior: Behavior normal. Behavior is cooperative.        Thought Content: Thought content normal.        Cognition and Memory: Cognition and memory normal.        Judgment: Judgment normal.     Assessment  Plan  Annual physical exam Fasting labs in 2 months after resuming thyroid med  Fluutd TdapUTD  Given hep B vx x 3  doses immune Had 1/3HPV vaccine from Growing child in [place] pt had bad reaction Pfizer 1/2 as of 09/13/19 needs 2nd dose   Referred for skin check [facility]se with [facility]  PrevCounseled on smoking cessation 2 cig qd cut back congratulated Pap3/1/19  neg +yeast better Counseled safe sex practices as of 09/13/19 declines STD check  Hypothyroidism, unspecified type - Plan: levothyroxine (SYNTHROID) 125 MCG tablet  Encounter for surveillance of contraceptive pills - Plan: levonorgestrel-ethinyl estradiol (SRONYX) 0.1-20 MG-MCG tablet   Provider: Dr. French Ana McLean-Scocuzza-Internal Medicine

## 2019-09-25 ENCOUNTER — Telehealth: Payer: Self-pay | Admitting: Internal Medicine

## 2019-09-25 NOTE — Telephone Encounter (Signed)
Call pt with # to [facility] dermatology schedule when ready   TMS

## 2019-09-25 NOTE — Telephone Encounter (Signed)
Rejection Reason - Patient did not respond - Left 3 messages with no returned call" [facility] said about 8 hours ago

## 2019-09-27 NOTE — Telephone Encounter (Signed)
I called pt and left a vm with the number to Rober Minion on cell.

## 2019-11-13 ENCOUNTER — Other Ambulatory Visit: Payer: 59

## 2020-01-14 ENCOUNTER — Other Ambulatory Visit: Payer: Self-pay

## 2020-01-14 ENCOUNTER — Ambulatory Visit
Admission: EM | Admit: 2020-01-14 | Discharge: 2020-01-14 | Disposition: A | Discharge status: Home / Self Care | Payer: 59

## 2020-03-24 ENCOUNTER — Encounter: Payer: Self-pay | Admitting: Internal Medicine

## 2020-03-24 ENCOUNTER — Ambulatory Visit (INDEPENDENT_AMBULATORY_CARE_PROVIDER_SITE_OTHER): Payer: 59 | Admitting: Internal Medicine

## 2020-03-24 ENCOUNTER — Other Ambulatory Visit: Payer: Self-pay

## 2020-03-24 ENCOUNTER — Other Ambulatory Visit (HOSPITAL_COMMUNITY)
Admission: RE | Admit: 2020-03-24 | Discharge: 2020-03-24 | Disposition: A | Discharge status: Home / Self Care | Payer: 59 | Source: Ambulatory Visit | Attending: Internal Medicine | Admitting: Internal Medicine

## 2020-03-24 VITALS — BP 116/70 | HR 73 | Temp 98.6°F | Ht 62.0 in | Wt 137.8 lb

## 2020-03-24 DIAGNOSIS — Z13818 Encounter for screening for other digestive system disorders: Secondary | ICD-10-CM | POA: Diagnosis not present

## 2020-03-24 DIAGNOSIS — E538 Deficiency of other specified B group vitamins: Secondary | ICD-10-CM | POA: Diagnosis not present

## 2020-03-24 DIAGNOSIS — E039 Hypothyroidism, unspecified: Secondary | ICD-10-CM | POA: Diagnosis not present

## 2020-03-24 DIAGNOSIS — F419 Anxiety disorder, unspecified: Secondary | ICD-10-CM | POA: Diagnosis not present

## 2020-03-24 DIAGNOSIS — Z1389 Encounter for screening for other disorder: Secondary | ICD-10-CM

## 2020-03-24 DIAGNOSIS — B379 Candidiasis, unspecified: Secondary | ICD-10-CM | POA: Diagnosis not present

## 2020-03-24 DIAGNOSIS — Z0000 Encounter for general adult medical examination without abnormal findings: Secondary | ICD-10-CM

## 2020-03-24 DIAGNOSIS — E559 Vitamin D deficiency, unspecified: Secondary | ICD-10-CM

## 2020-03-24 DIAGNOSIS — Z23 Encounter for immunization: Secondary | ICD-10-CM

## 2020-03-24 DIAGNOSIS — N760 Acute vaginitis: Secondary | ICD-10-CM | POA: Diagnosis present

## 2020-03-24 DIAGNOSIS — F32A Depression, unspecified: Secondary | ICD-10-CM | POA: Diagnosis not present

## 2020-03-24 DIAGNOSIS — R5383 Other fatigue: Secondary | ICD-10-CM

## 2020-03-24 LAB — CBC WITH DIFFERENTIAL/PLATELET
Basophils Absolute: 0 10*3/uL (ref 0.0–0.1)
Basophils Relative: 0.4 % (ref 0.0–3.0)
Eosinophils Absolute: 0.1 10*3/uL (ref 0.0–0.7)
Eosinophils Relative: 1.4 % (ref 0.0–5.0)
HCT: 40.9 % (ref 36.0–46.0)
Hemoglobin: 13.8 g/dL (ref 12.0–15.0)
Lymphocytes Relative: 26.8 % (ref 12.0–46.0)
Lymphs Abs: 1.7 10*3/uL (ref 0.7–4.0)
MCHC: 33.7 g/dL (ref 30.0–36.0)
MCV: 91.8 fl (ref 78.0–100.0)
Monocytes Absolute: 0.4 10*3/uL (ref 0.1–1.0)
Monocytes Relative: 6.3 % (ref 3.0–12.0)
Neutro Abs: 4.2 10*3/uL (ref 1.4–7.7)
Neutrophils Relative %: 65.1 % (ref 43.0–77.0)
Platelets: 248 10*3/uL (ref 150.0–400.0)
RBC: 4.45 Mil/uL (ref 3.87–5.11)
RDW: 13.4 % (ref 11.5–15.5)
WBC: 6.5 10*3/uL (ref 4.0–10.5)

## 2020-03-24 LAB — COMPREHENSIVE METABOLIC PANEL
ALT: 15 U/L (ref 0–35)
AST: 15 U/L (ref 0–37)
Albumin: 4.5 g/dL (ref 3.5–5.2)
Alkaline Phosphatase: 58 U/L (ref 39–117)
BUN: 8 mg/dL (ref 6–23)
CO2: 30 mEq/L (ref 19–32)
Calcium: 9.4 mg/dL (ref 8.4–10.5)
Chloride: 105 mEq/L (ref 96–112)
Creatinine, Ser: 0.65 mg/dL (ref 0.40–1.20)
GFR: 123.58 mL/min (ref >60.00)
Glucose, Bld: 105 mg/dL — ABNORMAL HIGH (ref 70–99)
Potassium: 4.2 mEq/L (ref 3.5–5.1)
Sodium: 140 mEq/L (ref 135–145)
Total Bilirubin: 0.4 mg/dL (ref 0.2–1.2)
Total Protein: 6.7 g/dL (ref 6.0–8.3)

## 2020-03-24 LAB — URINALYSIS, ROUTINE W REFLEX MICROSCOPIC
Bilirubin Urine: NEGATIVE
Hgb urine dipstick: NEGATIVE
Ketones, ur: NEGATIVE
Leukocytes,Ua: NEGATIVE
Nitrite: NEGATIVE
RBC / HPF: NONE SEEN (ref 0–?)
Specific Gravity, Urine: 1.01 (ref 1.000–1.030)
Total Protein, Urine: NEGATIVE
Urine Glucose: NEGATIVE
Urobilinogen, UA: 0.2 (ref 0.0–1.0)
pH: 7.5 (ref 5.0–8.0)

## 2020-03-24 LAB — TSH: TSH: 0.98 u[IU]/mL (ref 0.35–4.50)

## 2020-03-24 LAB — VITAMIN D 25 HYDROXY (VIT D DEFICIENCY, FRACTURES): VITD: 34.94 ng/mL (ref 30.00–100.00)

## 2020-03-24 LAB — VITAMIN B12: Vitamin B-12: 491 pg/mL (ref 211–911)

## 2020-03-24 MED ORDER — FLUCONAZOLE 150 MG PO TABS: 150.0000 mg | ORAL_TABLET | Freq: Once | ORAL | 0 refills | Status: AC

## 2020-03-24 NOTE — Progress Notes (Addendum)
Chief Complaint  Patient presents with  . Follow-up  . Immunizations   F/u  1. Flu shot today  2. Worsening anxiety due to break up with partner and life lexapro 20 mg qd not helping any longer she is in therapy but therapist rec med change recently due to increased anxiety. She stopped drinking  3. Vaginitis itchy vaginal area using vagisil h/o yeast infection  4. Hypothyroidism on synthyroid 125 mcg qd and due to have labs checked today but not fasting. She c/o fatigue    Review of Systems  Constitutional: Negative for weight loss.  HENT: Negative for hearing loss.   Eyes: Negative for blurred vision.  Respiratory: Negative for shortness of breath.   Cardiovascular: Negative for chest pain.  Gastrointestinal: Negative for abdominal pain.  Musculoskeletal: Negative for falls and joint pain.  Skin: Negative for rash.  Neurological: Negative for headaches.  Psychiatric/Behavioral: The patient is nervous/anxious.    Past Medical History:  Diagnosis Date  . Depression   . Hypothyroidism 2011   since age 3 y.o   . Hypothyroidism   . Hypothyroidism   . Kidney stones   . Vitamin D deficiency    Past Surgical History:  Procedure Laterality Date  . WISDOM TOOTH EXTRACTION     Family History  Problem Relation Age of Onset  . Depression Mother   . Hypertension Maternal Grandmother   . Thyroid disease Maternal Grandmother   . Depression Maternal Grandmother   . Cancer Maternal Grandmother        BCC   . Hypertension Maternal Grandfather   . Cancer Maternal Grandfather        lung  . Stroke Other   . Diabetes Other   . Thyroid disease Paternal Grandmother   . Cancer Paternal Grandfather        prostate  . Depression Maternal Aunt   . Diabetes Mellitus I Brother   . Alcohol abuse Brother   . Bladder Cancer Neg Hx   . Kidney cancer Neg Hx    Social History   Socioeconomic History  . Marital status: Single    Spouse name: Not on file  . Number of children: Not on  file  . Years of education: Not on file  . Highest education level: Not on file  Occupational History  . Not on file  Tobacco Use  . Smoking status: Light Tobacco Smoker  . Smokeless tobacco: Never Used  . Tobacco comment: 1 pk lasts weeks as of 03/09/17   Substance and Sexual Activity  . Alcohol use: No  . Drug use: No  . Sexual activity: Yes    Birth control/protection: Pill  Other Topics Concern  . Not on file  Social History Narrative   Lives in [place]. 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 Difficulty of Paying Living Expenses: Not on file  Food Insecurity:   . Worried About Programme researcher, broadcasting/film/video in the Last Year: Not on file  . Ran Out of Food in the Last Year: Not on file  Transportation Needs:   . Lack of Transportation (Medical): Not on file  .  Lack of Transportation (Non-Medical): Not on file  Physical Activity:   . Days of Exercise per Week: Not on file  . Minutes of Exercise per Session: Not on file  Stress:   . Feeling of Stress : Not on file  Social Connections:   . Frequency of Communication with Friends and Family: Not on file  . Frequency of Social Gatherings with Friends and Family: Not on file  . Attends Religious Services: Not on file  . Active Member of Clubs or Organizations: Not on file  . Attends Banker Meetings: Not on file  . Marital Status: Not on file  Intimate Partner Violence:   . Fear of Current or Ex-Partner: Not on file  . Emotionally Abused: Not on file  . Physically Abused: Not on file  . Sexually Abused: Not on file   Current Meds  Medication Sig  . escitalopram (LEXAPRO) 20 MG tablet Take 1 tablet (20 mg total) by mouth  daily.  Marland Kitchen levonorgestrel-ethinyl estradiol (SRONYX) 0.1-20 MG-MCG tablet Take 1 tablet by mouth daily.  Marland Kitchen levothyroxine (SYNTHROID) 125 MCG tablet Take 1 tablet (125 mcg total) by mouth daily before breakfast. On empty stomach 2-3 hours before other meds   Allergies  Allergen Reactions  . Augmentin [Amoxicillin-Pot Clavulanate] Hives  . Augmentin [Amoxicillin-Pot Clavulanate] Hives   No results found for this or any previous visit (from the past 2160 hour(s)). Objective  Body mass index is 25.2 kg/m. Wt Readings from Last 3 Encounters:  03/24/20 137 lb 12.8 oz (62.5 kg)  09/13/19 137 lb 12.8 oz (62.5 kg)  05/08/19 150 lb (68 kg)   Temp Readings from Last 3 Encounters:  03/24/20 98.6 F (37 C) (Oral)  09/13/19 97.9 F (36.6 C) (Temporal)  05/08/19 98.8 F (37.1 C) (Oral)   BP Readings from Last 3 Encounters:  03/24/20 116/70  09/13/19 120/82  05/08/19 124/80   Pulse Readings from Last 3 Encounters:  03/24/20 73  09/13/19 80  05/08/19 84    Physical Exam Vitals and nursing note reviewed.  Constitutional:      Appearance: Normal appearance. She is well-developed, well-groomed and overweight.  HENT:     Head: Normocephalic and atraumatic.  Eyes:     Conjunctiva/sclera: Conjunctivae normal.     Pupils: Pupils are equal, round, and reactive to light.  Cardiovascular:     Rate and Rhythm: Normal rate and regular rhythm.     Heart sounds: Normal heart sounds. No murmur heard.   Pulmonary:     Effort: Pulmonary effort is normal.     Breath sounds: Normal breath sounds.  Skin:    General: Skin is warm.  Neurological:     General: No focal deficit present.     Mental Status: She is alert and oriented to person, place, and time. Mental status is at baseline.     Gait: Gait normal.  Psychiatric:        Attention and Perception: Attention and perception normal.        Mood and Affect: Mood and affect normal.        Speech: Speech normal.        Behavior: Behavior  normal. Behavior is cooperative.        Thought Content: Thought content normal.        Cognition and Memory: Cognition and memory normal.        Judgment: Judgment normal.     Assessment  Plan  Anxiety and depression anxiety uncontrolled - Plan: Ambulatory  referral to Psychiatry [facility] if not able to see pt will refer to psych in [facility] [place]  Hypothyroidism, unspecified type - Plan: TSH On levo 125 mcg qd consider [facility] in the future variable TSH   Yeast infection - Plan: fluconazole (DIFLUCAN) 150 MG tablet Urine cyto  Vitamin D deficiency - Plan: Vitamin D (25 hydroxy)  HM Labs today not fasting and will need to check lipid in the future  Fluutd TdapUTD  Given hep B vx x 3 doses immune Had 1/3HPV vaccine from Growing child in [place] pt had bad reaction Pfizer 1/2 as of 11/30/21needs 2nd dose may need to restart series  Referred for skin check tbse with [facility] in [facility] 04/2020  rec smoking cessation smoking 1 cig qd   PrevCounseled on smoking cessation 2 cig qd cut back congratulated Pap3/1/19 neg +yeast better -pap due at f/u 2022   Counseled safe sex practicesas of  Hypothyroidism, unspecified type - Plan: levothyroxine (SYNTHROID) 125 MCG tablet  Encounter for surveillance of contraceptive pills - Plan: levonorgestrel-ethinyl estradiol (SRONYX) 0.1-20 MG-MCG tablet   Provider: Dr. French Ana McLean-Scocuzza-Internal Medicine

## 2020-03-24 NOTE — Addendum Note (Signed)
Addended by: Quentin Ore on: 03/24/2020 10:55 AM   Modules accepted: Orders

## 2020-03-24 NOTE — Patient Instructions (Addendum)
Referred [facility] in [place] or consider [facility] psychiatry if they are not available    Managing Anxiety, Adult After being diagnosed with an anxiety disorder, you may be relieved to know why you have felt or behaved a certain way. You may also feel overwhelmed about the treatment ahead and what it will mean for your life. With care and support, you can manage this condition and recover from it. How to manage lifestyle changes Managing stress and anxiety  Stress is your body's reaction to life changes and events, both good and bad. Most stress will last just a few hours, but stress can be ongoing and can lead to more than just stress. Although stress can play a major role in anxiety, it is not the same as anxiety. Stress is usually caused by something external, such as a deadline, test, or competition. Stress normally passes after the triggering event has ended.  Anxiety is caused by something internal, such as imagining a terrible outcome or worrying that something will go wrong that will devastate you. Anxiety often does not go away even after the triggering event is over, and it can become long-term (chronic) worry. It is important to understand the differences between stress and anxiety and to manage your stress effectively so that it does not lead to an anxious response. Talk with your health care provider or a counselor to learn more about reducing anxiety and stress. He or she may suggest tension reduction techniques, such as:  Music therapy. This can include creating or listening to music that you enjoy and that inspires you.  Mindfulness-based meditation. This involves being aware of your normal breaths while not trying to control your breathing. It can be done while sitting or walking.  Centering prayer. This involves focusing on a word, phrase, or sacred image that means something to you and brings you peace.  Deep breathing. To do this, expand your stomach  and inhale slowly through your nose. Hold your breath for 3-5 seconds. Then exhale slowly, letting your stomach muscles relax.  Self-talk. This involves identifying thought patterns that lead to anxiety reactions and changing those patterns.  Muscle relaxation. This involves tensing muscles and then relaxing them. Choose a tension reduction technique that suits your lifestyle and personality. These techniques take time and practice. Set aside 5-15 minutes a day to do them. Therapists can offer counseling and training in these techniques. The training to help with anxiety may be covered by some insurance plans. Other things you can do to manage stress and anxiety include:  Keeping a stress/anxiety diary. This can help you learn what triggers your reaction and then learn ways to manage your response.  Thinking about how you react to certain situations. You may not be able to control everything, but you can control your response.  Making time for activities that help you relax and not feeling guilty about spending your time in this way.  Visual imagery and yoga can help you stay calm and relax.  Medicines Medicines can help ease symptoms. Medicines for anxiety include:  Anti-anxiety drugs.  Antidepressants. Medicines are often used as a primary treatment for anxiety disorder. Medicines will be prescribed by a health care provider. When used together, medicines, psychotherapy, and tension reduction techniques may be the most effective treatment. Relationships Relationships can play a big part in helping you recover. Try to spend more time connecting with trusted friends and family members. Consider going to couples counseling, taking family education classes, or going  to family therapy. Therapy can help you and others better understand your condition. How to recognize changes in your anxiety Everyone responds differently to treatment for anxiety. Recovery from anxiety happens when symptoms  decrease and stop interfering with your daily activities at home or work. This may mean that you will start to:  Have better concentration and focus. Worry will interfere less in your daily thinking.  Sleep better.  Be less irritable.  Have more energy.  Have improved memory. It is important to recognize when your condition is getting worse. Contact your health care provider if your symptoms interfere with home or work and you feel like your condition is not improving. Follow these instructions at home: Activity  Exercise. Most adults should do the following: ? Exercise for at least 150 minutes each week. The exercise should increase your heart rate and make you sweat (moderate-intensity exercise). ? Strengthening exercises at least twice a week.  Get the right amount and quality of sleep. Most adults need 7-9 hours of sleep each night. Lifestyle   Eat a healthy diet that includes plenty of vegetables, fruits, whole grains, low-fat dairy products, and lean protein. Do not eat a lot of foods that are high in solid fats, added sugars, or salt.  Make choices that simplify your life.  Do not use any products that contain nicotine or tobacco, such as cigarettes, e-cigarettes, and chewing tobacco. If you need help quitting, ask your health care provider.  Avoid caffeine, alcohol, and certain over-the-counter cold medicines. These may make you feel worse. Ask your pharmacist which medicines to avoid. General instructions  Take over-the-counter and prescription medicines only as told by your health care provider.  Keep all follow-up visits as told by your health care provider. This is important. Where to find support You can get help and support from these sources:  Self-help groups.  Online and [employer].  A trusted spiritual leader.  Couples counseling.  Family education classes.  Family therapy. Where to find more information You may find that joining a  support group helps you deal with your anxiety. The following sources can help you locate counselors or support groups near you:  Mental Health America: www.mentalhealthamerica.net  Anxiety and Depression Association of [place] (ADAA): ProgramCam.de  [employer] on Mental Illness (NAMI): www.nami.org Contact a health care provider if you:  Have a hard time staying focused or finishing daily tasks.  Spend many hours a day feeling worried about everyday life.  Become exhausted by worry.  Start to have headaches, feel tense, or have nausea.  Urinate more than normal.  Have diarrhea. Get help right away if you have:  A racing heart and shortness of breath.  Thoughts of hurting yourself or others. If you ever feel like you may hurt yourself or others, or have thoughts about taking your own life, get help right away. You can go to your nearest emergency department or call:  Your local emergency services (911 in the [place]  A suicide crisis helpline, such as the National Suicide Prevention Lifeline at (531) 582-3406. This is open 24 hours a day. Summary  Taking steps to learn and use tension reduction techniques can help calm you and help prevent triggering an anxiety reaction.  When used together, medicines, psychotherapy, and tension reduction techniques may be the most effective treatment.  Family, friends, and partners can play a big part in helping you recover from an anxiety disorder. This information is not intended to replace advice given to you by  your health care provider. Make sure you discuss any questions you have with your health care provider. Document Revised: 09/11/2018 Document Reviewed: 09/11/2018 Elsevier Patient Education  2020 Elsevier Inc.  Generalized Anxiety Disorder, Adult Generalized anxiety disorder (GAD) is a mental health disorder. People with this condition constantly worry about everyday events. Unlike normal anxiety, worry related to GAD is  not triggered by a specific event. These worries also do not fade or get better with time. GAD interferes with life functions, including relationships, work, and school. GAD can vary from mild to severe. People with severe GAD can have intense waves of anxiety with physical symptoms (panic attacks). What are the causes? The exact cause of GAD is not known. What increases the risk? This condition is more likely to develop in:  Women.  People who have a family history of anxiety disorders.  People who are very shy.  People who experience very stressful life events, such as the death of a loved one.  People who have a very stressful family environment. What are the signs or symptoms? People with GAD often worry excessively about many things in their lives, such as their health and family. They may also be overly concerned about:  Doing well at work.  Being on time.  Natural disasters.  Friendships. Physical symptoms of GAD include:  Fatigue.  Muscle tension or having muscle twitches.  Trembling or feeling shaky.  Being easily startled.  Feeling like your heart is pounding or racing.  Feeling out of breath or like you cannot take a deep breath.  Having trouble falling asleep or staying asleep.  Sweating.  Nausea, diarrhea, or irritable bowel syndrome (IBS).  Headaches.  Trouble concentrating or remembering facts.  Restlessness.  Irritability. How is this diagnosed? Your health care provider can diagnose GAD based on your symptoms and medical history. You will also have a physical exam. The health care provider will ask specific questions about your symptoms, including how severe they are, when they started, and if they come and go. Your health care provider may ask you about your use of alcohol or drugs, including prescription medicines. Your health care provider may refer you to a mental health specialist for further evaluation. Your health care provider will do a  thorough examination and may perform additional tests to rule out other possible causes of your symptoms. To be diagnosed with GAD, a person must have anxiety that:  Is out of his or her control.  Affects several different aspects of his or her life, such as work and relationships.  Causes distress that makes him or her unable to take part in normal activities.  Includes at least three physical symptoms of GAD, such as restlessness, fatigue, trouble concentrating, irritability, muscle tension, or sleep problems. Before your health care provider can confirm a diagnosis of GAD, these symptoms must be present more days than they are not, and they must last for six months or longer. How is this treated? The following therapies are usually used to treat GAD:  Medicine. Antidepressant medicine is usually prescribed for long-term daily control. Antianxiety medicines may be added in severe cases, especially when panic attacks occur.  Talk therapy (psychotherapy). Certain types of talk therapy can be helpful in treating GAD by providing support, education, and guidance. Options include: ? Cognitive behavioral therapy (CBT). People learn coping skills and techniques to ease their anxiety. They learn to identify unrealistic or negative thoughts and behaviors and to replace them with positive ones. ? Acceptance and  commitment therapy (ACT). This treatment teaches people how to be mindful as a way to cope with unwanted thoughts and feelings. ? Biofeedback. This process trains you to manage your body's response (physiological response) through breathing techniques and relaxation methods. You will work with a therapist while machines are used to monitor your physical symptoms.  Stress management techniques. These include yoga, meditation, and exercise. A mental health specialist can help determine which treatment is best for you. Some people see improvement with one type of therapy. However, other people  require a combination of therapies. Follow these instructions at home:  Take over-the-counter and prescription medicines only as told by your health care provider.  Try to maintain a normal routine.  Try to anticipate stressful situations and allow extra time to manage them.  Practice any stress management or self-calming techniques as taught by your health care provider.  Do not punish yourself for setbacks or for not making progress.  Try to recognize your accomplishments, even if they are small.  Keep all follow-up visits as told by your health care provider. This is important. Contact a health care provider if:  Your symptoms do not get better.  Your symptoms get worse.  You have signs of depression, such as: ? A persistently sad, cranky, or irritable mood. ? Loss of enjoyment in activities that used to bring you joy. ? Change in weight or eating. ? Changes in sleeping habits. ? Avoiding friends or family members. ? Loss of energy for normal tasks. ? Feelings of guilt or worthlessness. Get help right away if:  You have serious thoughts about hurting yourself or others. If you ever feel like you may hurt yourself or others, or have thoughts about taking your own life, get help right away. You can go to your nearest emergency department or call:  Your local emergency services (911 in the [place]  A suicide crisis helpline, such as the National Suicide Prevention Lifeline at 71748178221-386-756-2511. This is open 24 hours a day. Summary  Generalized anxiety disorder (GAD) is a mental health disorder that involves worry that is not triggered by a specific event.  People with GAD often worry excessively about many things in their lives, such as their health and family.  GAD may cause physical symptoms such as restlessness, trouble concentrating, sleep problems, frequent sweating, nausea, diarrhea, headaches, and trembling or muscle twitching.  A mental health specialist can help  determine which treatment is best for you. Some people see improvement with one type of therapy. However, other people require a combination of therapies. This information is not intended to replace advice given to you by your health care provider. Make sure you discuss any questions you have with your health care provider. Document Revised: 03/24/2017 Document Reviewed: 03/01/2016 Elsevier Patient Education  2020 [employer]

## 2020-03-25 LAB — HEPATITIS C ANTIBODY
Hepatitis C Ab: NONREACTIVE
SIGNAL TO CUT-OFF: 0.01 (ref <1.00)

## 2020-03-26 LAB — URINE CYTOLOGY ANCILLARY ONLY
Bacterial Vaginitis-Urine: NEGATIVE
Candida Urine: POSITIVE — AB
Chlamydia: NEGATIVE
Comment: NEGATIVE
Comment: NEGATIVE
Comment: NORMAL
Neisseria Gonorrhea: NEGATIVE
Trichomonas: NEGATIVE

## 2020-04-08 ENCOUNTER — Telehealth: Payer: Self-pay | Admitting: Internal Medicine

## 2020-04-08 NOTE — Telephone Encounter (Signed)
lft vm at [facility] to follow up on referral.

## 2020-09-09 ENCOUNTER — Other Ambulatory Visit: Payer: Self-pay | Admitting: Internal Medicine

## 2020-09-09 DIAGNOSIS — F32A Depression, unspecified: Secondary | ICD-10-CM

## 2020-09-09 DIAGNOSIS — E039 Hypothyroidism, unspecified: Secondary | ICD-10-CM

## 2020-09-22 ENCOUNTER — Other Ambulatory Visit (HOSPITAL_COMMUNITY)
Admission: RE | Admit: 2020-09-22 | Discharge: 2020-09-22 | Disposition: A | Discharge status: Home / Self Care | Payer: 59 | Source: Ambulatory Visit | Attending: Internal Medicine | Admitting: Internal Medicine

## 2020-09-22 ENCOUNTER — Other Ambulatory Visit: Payer: Self-pay

## 2020-09-22 ENCOUNTER — Ambulatory Visit (INDEPENDENT_AMBULATORY_CARE_PROVIDER_SITE_OTHER): Payer: 59 | Admitting: Internal Medicine

## 2020-09-22 ENCOUNTER — Encounter: Payer: Self-pay | Admitting: Internal Medicine

## 2020-09-22 VITALS — BP 104/72 | HR 74 | Temp 98.0°F | Ht 62.13 in | Wt 147.0 lb

## 2020-09-22 DIAGNOSIS — Z0000 Encounter for general adult medical examination without abnormal findings: Secondary | ICD-10-CM

## 2020-09-22 DIAGNOSIS — R7989 Other specified abnormal findings of blood chemistry: Secondary | ICD-10-CM

## 2020-09-22 DIAGNOSIS — Z124 Encounter for screening for malignant neoplasm of cervix: Secondary | ICD-10-CM | POA: Diagnosis present

## 2020-09-22 DIAGNOSIS — E039 Hypothyroidism, unspecified: Secondary | ICD-10-CM | POA: Diagnosis not present

## 2020-09-22 DIAGNOSIS — Z3041 Encounter for surveillance of contraceptive pills: Secondary | ICD-10-CM

## 2020-09-22 DIAGNOSIS — Z13220 Encounter for screening for lipoid disorders: Secondary | ICD-10-CM

## 2020-09-22 DIAGNOSIS — R5383 Other fatigue: Secondary | ICD-10-CM

## 2020-09-22 LAB — LIPID PANEL
Cholesterol: 176 mg/dL (ref 0–200)
HDL: 57.3 mg/dL (ref >39.00)
LDL Cholesterol: 104 mg/dL — ABNORMAL HIGH (ref 0–99)
NonHDL: 118.82
Total CHOL/HDL Ratio: 3
Triglycerides: 76 mg/dL (ref 0.0–149.0)
VLDL: 15.2 mg/dL (ref 0.0–40.0)

## 2020-09-22 LAB — COMPREHENSIVE METABOLIC PANEL
ALT: 14 U/L (ref 0–35)
AST: 15 U/L (ref 0–37)
Albumin: 4.2 g/dL (ref 3.5–5.2)
Alkaline Phosphatase: 59 U/L (ref 39–117)
BUN: 13 mg/dL (ref 6–23)
CO2: 22 mEq/L (ref 19–32)
Calcium: 8.9 mg/dL (ref 8.4–10.5)
Chloride: 107 mEq/L (ref 96–112)
Creatinine, Ser: 0.65 mg/dL (ref 0.40–1.20)
GFR: 123.15 mL/min (ref >60.00)
Glucose, Bld: 87 mg/dL (ref 70–99)
Potassium: 4.3 mEq/L (ref 3.5–5.1)
Sodium: 139 mEq/L (ref 135–145)
Total Bilirubin: 0.5 mg/dL (ref 0.2–1.2)
Total Protein: 6.2 g/dL (ref 6.0–8.3)

## 2020-09-22 LAB — CBC WITH DIFFERENTIAL/PLATELET
Basophils Absolute: 0 10*3/uL (ref 0.0–0.1)
Basophils Relative: 0.1 % (ref 0.0–3.0)
Eosinophils Absolute: 0.1 10*3/uL (ref 0.0–0.7)
Eosinophils Relative: 1.3 % (ref 0.0–5.0)
HCT: 41.4 % (ref 36.0–46.0)
Hemoglobin: 14 g/dL (ref 12.0–15.0)
Lymphocytes Relative: 18.4 % (ref 12.0–46.0)
Lymphs Abs: 1.5 10*3/uL (ref 0.7–4.0)
MCHC: 33.8 g/dL (ref 30.0–36.0)
MCV: 90.7 fl (ref 78.0–100.0)
Monocytes Absolute: 0.4 10*3/uL (ref 0.1–1.0)
Monocytes Relative: 4.6 % (ref 3.0–12.0)
Neutro Abs: 6.3 10*3/uL (ref 1.4–7.7)
Neutrophils Relative %: 75.6 % (ref 43.0–77.0)
Platelets: 234 10*3/uL (ref 150.0–400.0)
RBC: 4.56 Mil/uL (ref 3.87–5.11)
RDW: 13.6 % (ref 11.5–15.5)
WBC: 8.3 10*3/uL (ref 4.0–10.5)

## 2020-09-22 LAB — TSH: TSH: 0.16 u[IU]/mL — ABNORMAL LOW (ref 0.35–4.50)

## 2020-09-22 MED ORDER — LEVONORGESTREL-ETHINYL ESTRAD 0.1-20 MG-MCG PO TABS: 1.0000 | ORAL_TABLET | Freq: Every day | ORAL | 4 refills | Status: DC

## 2020-09-22 NOTE — Progress Notes (Addendum)
Chief Complaint  Patient presents with  . Annual Exam  . Gynecologic Exam   Annual  1. Fatigue and hypothyroidism  2. Pap today   Review of Systems  Constitutional: Positive for malaise/fatigue. Negative for weight loss.  HENT: Negative for hearing loss.   Eyes: Negative for blurred vision.  Respiratory: Negative for shortness of breath.   Cardiovascular: Negative for chest pain.  Gastrointestinal: Negative for abdominal pain.  Musculoskeletal: Negative for falls and joint pain.  Skin: Negative for rash.  Neurological: Negative for headaches.  Psychiatric/Behavioral: Negative for depression.   Past Medical History:  Diagnosis Date  . COVID-19    03/2020  . Depression   . Hypothyroidism 2011   since age 68 y.o   . Hypothyroidism   . Hypothyroidism   . Kidney stones   . Vitamin D deficiency    Past Surgical History:  Procedure Laterality Date  . WISDOM TOOTH EXTRACTION     Family History  Problem Relation Age of Onset  . Depression Mother   . Hypertension Maternal Grandmother   . Thyroid disease Maternal Grandmother   . Depression Maternal Grandmother   . Cancer Maternal Grandmother        BCC   . Hypertension Maternal Grandfather   . Cancer Maternal Grandfather        lung  . Stroke Other   . Diabetes Other   . Thyroid disease Paternal Grandmother   . Cancer Paternal Grandfather        prostate  . Depression Maternal Aunt   . Diabetes Mellitus I Brother   . Alcohol abuse Brother   . Bladder Cancer Neg Hx   . Kidney cancer Neg Hx    Social History   Socioeconomic History  . Marital status: Single    Spouse name: Not on file  . Number of children: Not on file  . Years of education: Not on file  . Highest education level: Not on file  Occupational History  . Not on file  Tobacco Use  . Smoking status: Current Every Day Smoker    Types: Cigarettes  . Smokeless tobacco: Never Used  . Tobacco comment: 1 pk lasts weeks as of 03/09/17   Substance and  Sexual Activity  . Alcohol use: No  . Drug use: No  . Sexual activity: Yes    Birth control/protection: Pill  Other Topics Concern  . Not on file  Social History Narrative   Lives in [place]. 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 escitalopram (LEXAPRO) 20 MG tablet TAKE 1 TABLET BY MOUTH EVERY DAY  . levonorgestrel-ethinyl estradiol (SRONYX) 0.1-20 MG-MCG tablet Take 1 tablet by mouth daily.  Marland Kitchen levothyroxine (SYNTHROID) 125 MCG tablet TAKE 1 TABLET BY MOUTH DAILY BEFORE BREAKFAST. ON EMPTY STOMACH 2-3 HOURS BEFORE OTHER MEDS   Allergies  Allergen Reactions  . Augmentin [Amoxicillin-Pot  Clavulanate] Hives  . Augmentin [Amoxicillin-Pot Clavulanate] Hives   No results found for this or any previous visit (from the past 2160 hour(s)). Objective  Body mass index is 26.78 kg/m. Wt Readings from Last 3 Encounters:  09/22/20 147 lb (66.7 kg)  03/24/20 137 lb 12.8 oz (62.5 kg)  09/13/19 137 lb 12.8 oz (62.5 kg)   Temp Readings from Last 3 Encounters:  09/22/20 98 F (36.7 C) (Oral)  03/24/20 98.6 F (37 C) (Oral)  09/13/19 97.9 F (36.6 C) (Temporal)   BP Readings from Last 3 Encounters:  09/22/20 104/72  03/24/20 116/70  09/13/19 120/82   Pulse Readings from Last 3 Encounters:  09/22/20 74  03/24/20 73  09/13/19 80    Physical Exam Vitals and nursing note reviewed.  Constitutional:      Appearance: Normal appearance. She is  well-developed, well-groomed and overweight.  HENT:     Head: Normocephalic and atraumatic.  Eyes:     Conjunctiva/sclera: Conjunctivae normal.     Pupils: Pupils are equal, round, and reactive to light.  Cardiovascular:     Rate and Rhythm: Normal rate and regular rhythm.     Heart sounds: Normal heart sounds. No murmur heard.   Pulmonary:     Effort: Pulmonary effort is normal.     Breath sounds: Normal breath sounds.  Chest:  Breasts: Breasts are symmetrical.     Right: Normal. No axillary adenopathy.     Left: Normal. No axillary adenopathy.    Abdominal:     General: Abdomen is flat. Bowel sounds are normal.     Tenderness: There is no abdominal tenderness.  Genitourinary:    Exam position: Supine.     Pubic Area: No rash.      Labia:        Left: No rash.      Vagina: Normal.     Cervix: Discharge present.     Uterus: Normal.      Adnexa: Right adnexa normal and left adnexa normal.     Comments: Bloody discharge cervix cycle started   Lymphadenopathy:     Upper Body:     Right upper body: No axillary adenopathy.     Left upper body: No axillary adenopathy.  Skin:    General: Skin is warm and dry.  Neurological:     General: No focal deficit present.     Mental Status: She is alert and oriented to person, place, and time. Mental status is at baseline.     Gait: Gait normal.  Psychiatric:        Attention and Perception: Attention and perception normal.        Mood and Affect: Mood and affect normal.        Speech: Speech normal.        Behavior: Behavior normal. Behavior is cooperative.        Thought Content: Thought content normal.        Cognition and Memory: Cognition and memory normal.        Judgment: Judgment normal.     Assessment  Plan  Annual physical exam - Fluutd TdapUTD  1/3 covid rec covid vaccines Given hep B vx x 3 doses immune Had 1/3HPV vaccine from Growing child in [place] pt had bad reaction Pfizer 1/2 as of 09/13/19 needs  2nd-4th  doses  Referred in past for skin check [facility]  PrevCounseled on smoking cessation 2 cig qd cut back congratulated Pap3/1/19 neg +  yeast better Pap today with breast exam Counseled safe sex practicesas of 09/13/19 declines STD check rec healthy diet ane exercise   Hypothyroidism, unspecified type - Plan: Lipid panel, TSH, Comprehensive metabolic panel Levo 125 mcg qd  Results for VENDA, DICE (MRN [id number]) as of 09/24/2020 18:00  Ref. Range 07/03/2017 10:12 02/15/2018 16:03 08/22/2018 09:25 03/24/2020 11:05 09/22/2020 11:05  TSH Latest Ref Range: 0.35 - 4.50 uIU/mL 0.26 (L) 3.55 7.56 (H) 0.98 0.16 (L)  T4,Free(Direct) Latest Ref Range: 0.60 - 1.60 ng/dL   6.33    Thyroperoxidase Ab SerPl-aCnc Latest Ref Range: <9 IU/mL   42 (H)    Referred [facility]   Provider: Dr. French Ana McLean-[facility]oc[facility]

## 2020-09-23 ENCOUNTER — Telehealth: Payer: Self-pay | Admitting: Internal Medicine

## 2020-09-23 LAB — CYTOLOGY - PAP
Comment: NEGATIVE
Diagnosis: NEGATIVE
High risk HPV: NEGATIVE

## 2020-09-23 NOTE — Progress Notes (Signed)
Left message to return call 

## 2020-09-23 NOTE — Telephone Encounter (Signed)
Patient was returning call for results 

## 2020-09-23 NOTE — Telephone Encounter (Signed)
Patient informed and verbalized understanding

## 2020-09-24 ENCOUNTER — Telehealth: Payer: Self-pay

## 2020-09-24 NOTE — Addendum Note (Signed)
Addended by: Quentin Ore on: 09/24/2020 06:01 PM   Modules accepted: Orders

## 2020-09-24 NOTE — Telephone Encounter (Signed)
Patient was informed of results.  Patient understood and no questions, comments, or concerns at this time.  

## 2020-09-24 NOTE — Telephone Encounter (Signed)
-----   Message from Bevelyn Buckles, MD sent at 09/23/2020  2:28 PM EDT ----- Pap smear negative normal

## 2020-10-28 ENCOUNTER — Telehealth: Payer: Self-pay | Admitting: Internal Medicine

## 2020-10-28 ENCOUNTER — Encounter: Payer: Self-pay | Admitting: Internal Medicine

## 2020-10-28 NOTE — Telephone Encounter (Signed)
Rejection Reason - Patient did not respond - Patient has not responded to scheduling attempts. Please confirm that patient desires appointment with endocrinology. If so, please place a new referral for this patient." Lucita Ferrara said on Oct 28, 2020 8:47 AM  Msg from [facility] endo

## 2021-06-03 ENCOUNTER — Encounter: Payer: Self-pay | Admitting: Internal Medicine

## 2021-06-03 ENCOUNTER — Other Ambulatory Visit: Payer: Self-pay | Admitting: Internal Medicine

## 2021-06-03 ENCOUNTER — Other Ambulatory Visit: Payer: Self-pay

## 2021-06-03 ENCOUNTER — Ambulatory Visit (INDEPENDENT_AMBULATORY_CARE_PROVIDER_SITE_OTHER): Payer: 59 | Admitting: Internal Medicine

## 2021-06-03 VITALS — BP 124/66 | HR 91 | Temp 98.6°F | Ht 62.0 in | Wt 155.6 lb

## 2021-06-03 DIAGNOSIS — E039 Hypothyroidism, unspecified: Secondary | ICD-10-CM

## 2021-06-03 DIAGNOSIS — R5383 Other fatigue: Secondary | ICD-10-CM

## 2021-06-03 DIAGNOSIS — E611 Iron deficiency: Secondary | ICD-10-CM | POA: Diagnosis not present

## 2021-06-03 DIAGNOSIS — Z23 Encounter for immunization: Secondary | ICD-10-CM | POA: Diagnosis not present

## 2021-06-03 DIAGNOSIS — Z3041 Encounter for surveillance of contraceptive pills: Secondary | ICD-10-CM

## 2021-06-03 DIAGNOSIS — F32A Depression, unspecified: Secondary | ICD-10-CM

## 2021-06-03 DIAGNOSIS — F419 Anxiety disorder, unspecified: Secondary | ICD-10-CM

## 2021-06-03 LAB — T4, FREE: Free T4: 1.32 ng/dL (ref 0.60–1.60)

## 2021-06-03 LAB — TSH: TSH: 0.06 u[IU]/mL — ABNORMAL LOW (ref 0.35–5.50)

## 2021-06-03 LAB — IBC + FERRITIN
Ferritin: 53 ng/mL (ref 10.0–291.0)
Iron: 91 ug/dL (ref 42–145)
Saturation Ratios: 31.9 % (ref 20.0–50.0)
TIBC: 285.6 ug/dL (ref 250.0–450.0)
Transferrin: 204 mg/dL — ABNORMAL LOW (ref 212.0–360.0)

## 2021-06-03 MED ORDER — ESCITALOPRAM OXALATE 20 MG PO TABS: 20.0000 mg | ORAL_TABLET | Freq: Every day | ORAL | 3 refills | Status: DC

## 2021-06-03 MED ORDER — BUPROPION HCL ER (XL) 150 MG PO TB24: 150.0000 mg | ORAL_TABLET | Freq: Every day | ORAL | 3 refills | Status: DC

## 2021-06-03 MED ORDER — LEVONORGESTREL-ETHINYL ESTRAD 0.1-20 MG-MCG PO TABS: 1.0000 | ORAL_TABLET | Freq: Every day | ORAL | 4 refills | Status: DC

## 2021-06-03 NOTE — Progress Notes (Signed)
Chief Complaint  Patient presents with   Thyroid Problem   F/u  1. Hypothyroidism with variable tsh and fatigue Most Recent TSH: 0.16 (L) 09/22/20 11:05 T4,Free(Direct): 1.14 08/22/18 09:25 Thyroxine (T4): 9.2 01/27/12 09:00 Thyroperoxidase Ab SerPl-aCnc: 42 (H) 08/22/18 09:25  08/22/18 09:25 TSH: 7.56 (H) T4,Free(Direct): 1.14 Thyroperoxidase Ab SerPl-aCnc: 42 (H)  03/24/20 11:05 TSH: 0.98  09/22/20 11:05 TSH: 0.16 (L)   (H): Data is abnormally high (L): Data is abnormally low  2. Anxiety and depression phq 9 score 15 and gad 7 12  On lexapro 20 mg qd    Review of Systems  Constitutional:  Positive for malaise/fatigue. Negative for weight loss.  HENT:  Negative for hearing loss.   Eyes:  Negative for blurred vision.  Respiratory:  Negative for shortness of breath.   Cardiovascular:  Negative for chest pain.  Gastrointestinal:  Negative for abdominal pain and blood in stool.  Genitourinary:  Negative for dysuria.  Musculoskeletal:  Negative for falls and joint pain.  Skin:  Negative for rash.  Neurological:  Negative for headaches.  Psychiatric/Behavioral:  Positive for depression. The patient is nervous/anxious.   Past Medical History:  Diagnosis Date   COVID-19    03/2020   Depression    Hypothyroidism 2011   since age 19 y.o    Hypothyroidism    Hypothyroidism    Kidney stones    Vitamin D deficiency    Past Surgical History:  Procedure Laterality Date   WISDOM TOOTH EXTRACTION     Family History  Problem Relation Age of Onset   Depression Mother    Hypertension Maternal Grandmother    Thyroid disease Maternal Grandmother    Depression Maternal Grandmother    Cancer Maternal Grandmother        BCC    Hypertension Maternal Grandfather    Cancer Maternal Grandfather        lung   Stroke Other    Diabetes Other    Thyroid disease Paternal Grandmother    Cancer Paternal Grandfather        prostate   Depression Maternal Aunt    Diabetes Mellitus I  Brother    Alcohol abuse Brother    Bladder Cancer Neg Hx    Kidney cancer Neg Hx    Social History   Socioeconomic History   Marital status: Single    Spouse name: Not on file   Number of children: Not on file   Years of education: Not on file   Highest education level: Not on file  Occupational History   Not on file  Tobacco Use   Smoking status: Every Day    Types: Cigarettes   Smokeless tobacco: Never   Tobacco comments:    1 pk lasts weeks as of 03/09/17   Substance and Sexual Activity   Alcohol use: No   Drug use: No   Sexual activity: Yes    Birth control/protection: Pill  Other Topics Concern   Not on file  Social History Narrative   Lives in [place]. 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   levothyroxine (SYNTHROID) 125 MCG tablet TAKE 1 TABLET BY MOUTH DAILY BEFORE BREAKFAST. ON EMPTY STOMACH 2-3 HOURS BEFORE OTHER MEDS   [DISCONTINUED] escitalopram (LEXAPRO) 20 MG tablet TAKE 1 TABLET BY MOUTH EVERY DAY   [DISCONTINUED] levonorgestrel-ethinyl estradiol (SRONYX) 0.1-20 MG-MCG tablet Take 1 tablet by mouth daily.   Allergies  Allergen Reactions   Augmentin [Amoxicillin-Pot Clavulanate] Hives   Augmentin [Amoxicillin-Pot Clavulanate] Hives   No results found for this or any previous visit (from the past 2160 hour(s)). Objective  Body mass  index is 28.46 kg/m. Wt Readings from Last 3 Encounters:  06/03/21 155 lb 9.6 oz (70.6 kg)  09/22/20 147 lb (66.7 kg)  03/24/20 137 lb 12.8 oz (62.5 kg)   Temp Readings from Last 3 Encounters:  06/03/21 98.6 F (37 C) (Oral)  09/22/20 98 F (36.7 C) (Oral)  03/24/20 98.6 F (37 C) (Oral)   BP Readings from Last 3 Encounters:  06/03/21 124/66  09/22/20 104/72  03/24/20 116/70   Pulse Readings from Last 3 Encounters:  06/03/21 91  09/22/20 74  03/24/20 73    Physical Exam Vitals and nursing note reviewed.  Constitutional:      Appearance: Normal appearance. She is well-developed and well-groomed.  HENT:     Head: Normocephalic and atraumatic.  Eyes:     Conjunctiva/sclera: Conjunctivae normal.     Pupils: Pupils are equal, round, and reactive to light.  Cardiovascular:     Rate and Rhythm: Normal rate and regular rhythm.     Heart sounds: Normal heart sounds. No murmur heard. Pulmonary:     Effort: Pulmonary effort is normal.     Breath sounds: Normal breath sounds.  Abdominal:     General: Abdomen is flat. Bowel sounds are normal.     Tenderness: There is no abdominal tenderness.  Musculoskeletal:        General: No tenderness.  Skin:    General: Skin is warm and dry.  Neurological:     General: No focal deficit present.     Mental Status: She is alert and oriented to person, place, and time. Mental status is at baseline.     Cranial Nerves: Cranial nerves 2-12 are intact.     Motor: Motor function is intact.     Coordination: Coordination is intact.     Gait: Gait is intact.  Psychiatric:        Attention and Perception: Attention and perception normal.        Mood and Affect: Mood and affect normal.        Speech: Speech normal.        Behavior: Behavior normal. Behavior is cooperative.        Thought Content: Thought content normal.        Cognition and Memory: Cognition and memory normal.        Judgment: Judgment normal.    Assessment  Plan   Hypothyroidism, unspecified type - Plan: Ambulatory referral to Endocrinology [facility], TSH, Thyroid peroxidase antibody, T4, free  Fatigue, unspecified type - Plan: IBC + Ferritin Iron deficiency - Plan: IBC + Ferritin  Encounter for surveillance of contraceptive pills - Plan: levonorgestrel-ethinyl estradiol (SRONYX)  0.1-20 MG-MCG tablet  Anxiety and depression - Plan: escitalopram (LEXAPRO) 20 MG tablet, buPROPion (WELLBUTRIN XL) 150 MG 24 hr tablet   HM Flu utd given today Tdap UTD  1/3 covid rec covid vaccines declines  Given hep B vx x 3 doses immune Had 1/3 HPV vaccine from Growing child in [facility] pt had bad reaction  Pfizer 1/2 as of 09/13/19 needs 2nd-4th  doses   Referred in past for skin check tbse with [facility] prefers [facility]. Cheree [facility]   Prev Counseled on smoking cessation 2 cig qd cut back congratulated Pap 06/23/17 neg +yeast better  Pap 08/2020  Counseled safe sex practices as of 09/13/19 declines STD check rec healthy diet ane exercise     Hypothyroidism, unspecified type - Plan: Lipid panel, TSH, Comprehensive metabolic panel Levo 125 mcg qd  Results for Patricia Ruiz, Patricia Ruiz (MRN [id number]) as of 09/24/2020 18:00   Ref. Range 07/03/2017 10:12 02/15/2018 16:03 08/22/2018 09:25 03/24/2020 11:05 09/22/2020 11:05  TSH Latest Ref Range: 0.35 - 4.50 uIU/mL 0.26 (L) 3.55 7.56 (H) 0.98 0.16 (L)  T4,Free(Direct) Latest Ref Range: 0.60 - 1.60 ng/dL     2.50      Thyroperoxidase Ab SerPl-aCnc Latest Ref Range: <9 IU/mL     42 (H)      Referred [facility]     Provider: Dr. French Ana McLean-Scocuzza-[facility]

## 2021-06-03 NOTE — Patient Instructions (Addendum)
[facility]  [address]   [address] [facility]   [place], [place] [address]   Phone: [phone]   Fax: [phone]     Bupropion Extended-Release Tablets (Depression/Mood Disorders) What is this medication? BUPROPION (byoo PROE pee on) treats depression. It increases norepinephrine and dopamine in the brain, hormones that help regulate mood. It belongs to a group of medications called NDRIs. This medicine may be used for other purposes; ask your health care provider or pharmacist if you have questions. COMMON BRAND NAME(S): Aplenzin, Budeprion XL, Forfivo XL, Wellbutrin XL What should I tell my care team before I take this medication? They need to know if you have any of these conditions: An eating disorder, such as anorexia or bulimia Bipolar disorder or psychosis Diabetes or high blood sugar, treated with medication Glaucoma Head injury or brain tumor Heart disease, previous heart attack, or irregular heart beat High blood pressure Kidney or liver disease Seizures (convulsions) Suicidal thoughts or a previous suicide attempt Tourette's syndrome Weight loss An unusual or allergic reaction to bupropion, other medications, foods, dyes, or preservatives Pregnant or trying to become pregnant Breast-feeding How should I use this medication? Take this medication by mouth with a glass of water. Follow the directions on the prescription label. You can take it with or without food. If it upsets your stomach, take it with food. Do not crush, chew, or cut these tablets. This medication is taken once daily at the same time each day. Do not take your medication more often than directed. Do not stop taking this medication suddenly except upon the advice of your care team. Stopping this medication too quickly may cause serious side effects or your condition may worsen. A special MedGuide will be given to you by the pharmacist with each prescription and refill. Be  sure to read this information carefully each time. Talk to your care team about the use of this medication in children. Special care may be needed. Overdosage: If you think you have taken too much of this medicine contact a poison control center or emergency room at once. NOTE: This medicine is only for you. Do not share this medicine with others. What if I miss a dose? If you miss a dose, skip the missed dose and take your next tablet at the regular time. Do not take double or extra doses. What may interact with this medication? Do not take this medication with any of the following: Linezolid MAOIs like Azilect, Carbex, Eldepryl, Marplan, Nardil, and Parnate Methylene blue (injected into a vein) Other medications that contain bupropion like Zyban This medication may also interact with the following: Alcohol Certain medications for anxiety or sleep Certain medications for blood pressure like metoprolol, propranolol Certain medications for depression or psychotic disturbances Certain medications for HIV or AIDS like efavirenz, lopinavir, nelfinavir, ritonavir Certain medications for irregular heart beat like propafenone, flecainide Certain medications for Parkinson's disease like amantadine, levodopa Certain medications for seizures like carbamazepine, phenytoin, phenobarbital Cimetidine Clopidogrel Cyclophosphamide Digoxin Furazolidone Isoniazid Nicotine Orphenadrine Procarbazine Steroid medications like prednisone or cortisone Stimulant medications for attention disorders, weight loss, or to stay awake Tamoxifen Theophylline Thiotepa Ticlopidine Tramadol Warfarin This list may not describe all possible interactions. Give your health care provider a list of all the medicines, herbs, non-prescription drugs, or dietary supplements you use. Also tell them if you smoke, drink alcohol, or use illegal drugs. Some items may interact with your medicine. What should I watch for while  using this medication? Tell your care team if your symptoms do not get better or if they get worse. Visit your care team for regular checks on your progress. Because it may take several weeks to see the full effects of this medication, it is important to continue your treatment as prescribed. Watch for new or worsening thoughts of suicide or depression. This includes sudden changes in mood, behavior, or thoughts. These changes can happen at any time but are more common in the beginning of treatment or after a change in dose. Call your care team right away if you experience these thoughts or worsening depression. Manic episodes may happen in patients with bipolar disorder who take this medication. Watch for changes in feelings or behaviors such as feeling anxious, nervous, agitated, panicky, irritable, hostile, aggressive, impulsive, severely restless, overly excited and hyperactive, or trouble sleeping. These symptoms can happen at anytime but are more common in the beginning of treatment or after a change in dose. Call your care team right away if you notice any of these symptoms. This medication may cause serious skin reactions. They can happen weeks to months after starting the medication. Contact your care team right away if you notice fevers or flu-like symptoms with a rash. The rash may be red or purple and then turn into blisters or peeling of the skin. Or, you might notice a red rash with swelling of the face, lips or lymph nodes in your neck or under your arms. Avoid drinks that contain alcohol while taking this medication. Drinking large amounts of alcohol, using sleeping or anxiety medications, or quickly stopping the use of these agents while taking this medication may increase your risk for a seizure. Do not drive or use heavy machinery until you know how this medication affects you. This medication can impair your ability to perform these tasks. Do not take this medication close to bedtime. It  may prevent you from sleeping. Your mouth may get dry. Chewing sugarless gum or sucking hard candy, and drinking plenty of water may help. Contact your care team if the problem does not go away or is severe. The tablet shell for some brands of this medication does not dissolve. This is normal. The tablet shell may appear whole in the stool. This is not a cause for concern. What side effects may I notice from receiving this medication? Side effects that you should report to your care team as soon as possible: Allergic reactions--skin rash, itching, hives, swelling of the face, lips, tongue, or throat Increase in blood pressure Mood and behavior changes--anxiety, nervousness, confusion, hallucinations, irritability, hostility, thoughts of suicide or self-harm, worsening mood, feelings of depression Redness, blistering, peeling, or loosening of the skin, including inside the mouth Seizures Sudden eye pain or change in vision such as blurry vision, seeing halos around lights, vision loss Side effects that usually do not require medical attention (report to your care team if they continue or are bothersome): Constipation Dizziness Dry mouth Loss of appetite Nausea Tremors or shaking Trouble sleeping This list may not describe all possible side effects. Call your doctor for medical advice about side effects. You may report side effects to FDA at 1-800-FDA-1088. Where should I keep my medication? Keep out of the reach of children and pets. Store at room temperature between 15 and 30 degrees C (59 and 86 degrees F). Throw away any unused medication after the expiration date. NOTE: This sheet is a summary. It may not cover all possible information. If you  have questions about this medicine, talk to your doctor, pharmacist, or health care provider.  2022 Elsevier/Gold Standard (2020-06-24 00:00:00)

## 2021-06-04 LAB — THYROID PEROXIDASE ANTIBODY: Thyroperoxidase Ab SerPl-aCnc: 50 IU/mL — ABNORMAL HIGH (ref <9)

## 2021-09-10 ENCOUNTER — Other Ambulatory Visit: Payer: Self-pay | Admitting: Internal Medicine

## 2021-09-10 DIAGNOSIS — E039 Hypothyroidism, unspecified: Secondary | ICD-10-CM

## 2021-11-08 ENCOUNTER — Other Ambulatory Visit: Payer: Self-pay | Admitting: Internal Medicine

## 2021-11-08 DIAGNOSIS — Z3041 Encounter for surveillance of contraceptive pills: Secondary | ICD-10-CM

## 2022-05-03 ENCOUNTER — Other Ambulatory Visit: Payer: Self-pay | Admitting: Family

## 2022-05-03 DIAGNOSIS — Z3041 Encounter for surveillance of contraceptive pills: Secondary | ICD-10-CM

## 2022-07-13 ENCOUNTER — Telehealth: Payer: Self-pay

## 2022-07-13 NOTE — Telephone Encounter (Signed)
LMOM informing pt I was just calling to go over meds and confirm pharmacy.

## 2022-07-13 NOTE — Progress Notes (Unsigned)
Tomasita Morrow, NP-C Phone: [phone]  Patricia Ruiz is a 27 y.o. female who presents today for transfer of care and annual exam. She has no complaints or new concerns today. She has been out of her Wellbutrin and Lexapro for 2 weeks, she is requesting refills. She stopped taking her birth control in January. Reports feeling a lot better since doing so, reports a decrease in mood swings. She has had 2 regular cycles since stopping. Denies heavy or painful periods.   HYPOTHYROIDISM Disease Monitoring Weight changes: No  Skin Changes: No Palpitations: No Heat/Cold intolerance: No Medication Monitoring Compliance:  Levothyroxine 125 mcg   Last TSH:   Lab Results  Component Value Date   TSH 0.06 (L) 06/03/2021    Anxiety/Depression- Improved since stopping OCP. Well managed when on medications reports noticing some change in symptoms since she has been out of her medication. Denies SI/HI. She sees a therapist regularly.   Diet: Poor- quick, grab and go foods, fast food Exercise: Physically active job Pap smear: 09/22/2020 Family history-  Colon cancer: No  Breast cancer: No  Ovarian cancer: No Menses: LMP- 07/04/2022 Sexually active: Yes Vaccines-   Flu: Declined  Tetanus: 03/09/2017  COVID19: x 1 HIV screening: Negative Hep C Screening: Negative Tobacco use: Yes, trying to quit, down to 4 cigarettes daily Alcohol use: No Illicit Drug use: No Dentist: No Ophthalmology: No   Social History   Tobacco Use  Smoking Status Every Day   Types: Cigarettes  Smokeless Tobacco Never  Tobacco Comments   1 pk lasts weeks as of 03/09/17     Current Outpatient Medications on File Prior to Visit  Medication Sig Dispense Refill   levothyroxine (SYNTHROID) 125 MCG tablet TAKE 1 TABLET BY MOUTH DAILY BEFORE BREAKFAST. ON EMPTY STOMACH 2-3 HOURS BEFORE OTHER MEDS 90 tablet 3   No current facility-administered medications on file prior to visit.    ROS see history of present  illness  Objective  Physical Exam Vitals:   07/14/22 0906  BP: 118/68  Pulse: 63  Temp: 98.9 F (37.2 C)  SpO2: 99%    BP Readings from Last 3 Encounters:  07/14/22 118/68  06/03/21 124/66  09/22/20 104/72   Wt Readings from Last 3 Encounters:  07/14/22 167 lb 3.2 oz (75.8 kg)  06/03/21 155 lb 9.6 oz (70.6 kg)  09/22/20 147 lb (66.7 kg)    Physical Exam Constitutional:      General: She is not in acute distress.    Appearance: Normal appearance.  HENT:     Head: Normocephalic.     Right Ear: Tympanic membrane normal.     Left Ear: Tympanic membrane normal.     Nose: Nose normal.     Mouth/Throat:     Mouth: Mucous membranes are moist.     Pharynx: Oropharynx is clear.  Eyes:     Conjunctiva/sclera: Conjunctivae normal.     Pupils: Pupils are equal, round, and reactive to light.  Neck:     Thyroid: No thyromegaly.  Cardiovascular:     Rate and Rhythm: Normal rate and regular rhythm.     Heart sounds: Normal heart sounds.  Pulmonary:     Effort: Pulmonary effort is normal.     Breath sounds: Normal breath sounds.  Abdominal:     General: Abdomen is flat. Bowel sounds are normal.     Palpations: Abdomen is soft. There is no mass.     Tenderness: There is no abdominal tenderness.  Musculoskeletal:  General: Normal range of motion.  Lymphadenopathy:     Cervical: No cervical adenopathy.  Skin:    General: Skin is warm and dry.     Findings: No rash.  Neurological:     General: No focal deficit present.     Mental Status: She is alert.  Psychiatric:        Mood and Affect: Mood normal.        Behavior: Behavior normal.    Assessment/Plan: Please see individual problem list.  Preventative health care Assessment & Plan: Physical exam complete. Lab work as outlined. Will contact patient with results. Pap- UTD. Declined flu and additional COVID vaccines. Tetanus vaccine- UTD. Encouraged to continue smoking cessation. Recommended establishing with  Dentist and Ophthalmology for annual exams. Counseled patient on safe sex and using protection since she has recently stopped taking her birth control and is sexually active. Encouraged healthy diet and exercise.   Orders: -     CBC with Differential/Platelet -     Comprehensive metabolic panel  Hypothyroidism, unspecified type Assessment & Plan: Chronic. Hx of fluctuating TSH. Currently on Levothyroxine 125 mcg daily. She did see Endocrinology last year for evaluation. Will check TSH today and refer back if needed.   Orders: -     TSH  Vitamin D deficiency Assessment & Plan: Only takes OTC supplement during winter months. Reports recently stopping. Will check vitamin D level today.  Orders: -     VITAMIN D 25 Hydroxy (Vit-D Deficiency, Fractures)  Anxiety and depression Assessment & Plan: Chronic. Improved. Stable on Wellbutrin and Lexapro daily. Refills on medications sent. Denies SI/HI. Encouraged patient to contact if symptoms are worsening or changing. Continue with therapist.   Orders: -     buPROPion HCl ER (XL); Take 1 tablet (150 mg total) by mouth daily.  Dispense: 90 tablet; Refill: 3 -     Escitalopram Oxalate; Take 1 tablet (20 mg total) by mouth daily.  Dispense: 90 tablet; Refill: 3  Lipid screening -     Lipid panel   Return in about 1 year (around 07/14/2023) for Annual Exam.   Tomasita Morrow, NP-C [place]

## 2022-07-14 ENCOUNTER — Ambulatory Visit: Payer: 59 | Admitting: Nurse Practitioner

## 2022-07-14 ENCOUNTER — Encounter: Payer: Self-pay | Admitting: Nurse Practitioner

## 2022-07-14 VITALS — BP 118/68 | HR 63 | Temp 98.9°F | Ht 62.0 in | Wt 167.2 lb

## 2022-07-14 DIAGNOSIS — E039 Hypothyroidism, unspecified: Secondary | ICD-10-CM | POA: Diagnosis not present

## 2022-07-14 DIAGNOSIS — F32A Depression, unspecified: Secondary | ICD-10-CM

## 2022-07-14 DIAGNOSIS — Z0000 Encounter for general adult medical examination without abnormal findings: Secondary | ICD-10-CM | POA: Diagnosis not present

## 2022-07-14 DIAGNOSIS — Z13220 Encounter for screening for lipoid disorders: Secondary | ICD-10-CM | POA: Diagnosis not present

## 2022-07-14 DIAGNOSIS — E559 Vitamin D deficiency, unspecified: Secondary | ICD-10-CM | POA: Diagnosis not present

## 2022-07-14 DIAGNOSIS — F419 Anxiety disorder, unspecified: Secondary | ICD-10-CM

## 2022-07-14 LAB — CBC WITH DIFFERENTIAL/PLATELET
Basophils Absolute: 0 10*3/uL (ref 0.0–0.1)
Basophils Relative: 0.4 % (ref 0.0–3.0)
Eosinophils Absolute: 0.1 10*3/uL (ref 0.0–0.7)
Eosinophils Relative: 1.9 % (ref 0.0–5.0)
HCT: 42.7 % (ref 36.0–46.0)
Hemoglobin: 14.1 g/dL (ref 12.0–15.0)
Lymphocytes Relative: 23.7 % (ref 12.0–46.0)
Lymphs Abs: 1.7 10*3/uL (ref 0.7–4.0)
MCHC: 32.9 g/dL (ref 30.0–36.0)
MCV: 91.1 fl (ref 78.0–100.0)
Monocytes Absolute: 0.4 10*3/uL (ref 0.1–1.0)
Monocytes Relative: 6.2 % (ref 3.0–12.0)
Neutro Abs: 4.8 10*3/uL (ref 1.4–7.7)
Neutrophils Relative %: 67.8 % (ref 43.0–77.0)
Platelets: 293 10*3/uL (ref 150.0–400.0)
RBC: 4.69 Mil/uL (ref 3.87–5.11)
RDW: 13.4 % (ref 11.5–15.5)
WBC: 7.1 10*3/uL (ref 4.0–10.5)

## 2022-07-14 LAB — COMPREHENSIVE METABOLIC PANEL
ALT: 19 U/L (ref 0–35)
AST: 16 U/L (ref 0–37)
Albumin: 4.3 g/dL (ref 3.5–5.2)
Alkaline Phosphatase: 85 U/L (ref 39–117)
BUN: 12 mg/dL (ref 6–23)
CO2: 26 mEq/L (ref 19–32)
Calcium: 9.3 mg/dL (ref 8.4–10.5)
Chloride: 107 mEq/L (ref 96–112)
Creatinine, Ser: 0.64 mg/dL (ref 0.40–1.20)
GFR: 122.05 mL/min (ref >60.00)
Glucose, Bld: 92 mg/dL (ref 70–99)
Potassium: 4.7 mEq/L (ref 3.5–5.1)
Sodium: 140 mEq/L (ref 135–145)
Total Bilirubin: 0.5 mg/dL (ref 0.2–1.2)
Total Protein: 6.4 g/dL (ref 6.0–8.3)

## 2022-07-14 LAB — LIPID PANEL
Cholesterol: 183 mg/dL (ref 0–200)
HDL: 54.5 mg/dL (ref >39.00)
LDL Cholesterol: 118 mg/dL — ABNORMAL HIGH (ref 0–99)
NonHDL: 128.18
Total CHOL/HDL Ratio: 3
Triglycerides: 51 mg/dL (ref 0.0–149.0)
VLDL: 10.2 mg/dL (ref 0.0–40.0)

## 2022-07-14 LAB — VITAMIN D 25 HYDROXY (VIT D DEFICIENCY, FRACTURES): VITD: 20.37 ng/mL — ABNORMAL LOW (ref 30.00–100.00)

## 2022-07-14 LAB — TSH: TSH: 0.15 u[IU]/mL — ABNORMAL LOW (ref 0.35–5.50)

## 2022-07-14 MED ORDER — ESCITALOPRAM OXALATE 20 MG PO TABS: 20.0000 mg | ORAL_TABLET | Freq: Every day | ORAL | 3 refills | Status: DC

## 2022-07-14 MED ORDER — BUPROPION HCL ER (XL) 150 MG PO TB24: 150.0000 mg | ORAL_TABLET | Freq: Every day | ORAL | 3 refills | Status: DC

## 2022-07-14 NOTE — Assessment & Plan Note (Signed)
Only takes OTC supplement during winter months. Reports recently stopping. Will check vitamin D level today.

## 2022-07-14 NOTE — Assessment & Plan Note (Addendum)
Physical exam complete. Lab work as outlined. Will contact patient with results. Pap- UTD. Declined flu and additional COVID vaccines. Tetanus vaccine- UTD. Encouraged to continue smoking cessation. Recommended establishing with Dentist and Ophthalmology for annual exams. Counseled patient on safe sex and using protection since she has recently stopped taking her birth control and is sexually active. Encouraged healthy diet and exercise.

## 2022-07-14 NOTE — Assessment & Plan Note (Signed)
Chronic. Improved. Stable on Wellbutrin and Lexapro daily. Refills on medications sent. Denies SI/HI. Encouraged patient to contact if symptoms are worsening or changing. Continue with therapist.

## 2022-07-14 NOTE — Assessment & Plan Note (Signed)
Chronic. Hx of fluctuating TSH. Currently on Levothyroxine 125 mcg daily. She did see Endocrinology last year for evaluation. Will check TSH today and refer back if needed.

## 2022-07-15 ENCOUNTER — Other Ambulatory Visit: Payer: Self-pay | Admitting: Nurse Practitioner

## 2022-07-15 DIAGNOSIS — E039 Hypothyroidism, unspecified: Secondary | ICD-10-CM

## 2022-07-15 MED ORDER — LEVOTHYROXINE SODIUM 112 MCG PO TABS: 112.0000 ug | ORAL_TABLET | Freq: Every day | ORAL | 1 refills | Status: DC

## 2022-09-27 ENCOUNTER — Encounter: Payer: 59 | Admitting: Nurse Practitioner

## 2022-10-03 NOTE — Progress Notes (Deleted)
  Bethanie Dicker, NP-C Phone: [phone]  Patricia Ruiz is a 27 y.o. adult who presents today for follow up.   HYPOTHYROIDISM Disease Monitoring Weight changes: ***  Skin Changes: *** Palpitations: *** Heat/Cold intolerance: *** Medication Monitoring Compliance:  ***   Last TSH:   Lab Results  Component Value Date   TSH 0.15 (L) 07/14/2022     Social History   Tobacco Use  Smoking Status Every Day   Types: Cigarettes  Smokeless Tobacco Never  Tobacco Comments   1 pk lasts weeks as of 03/09/17     Current Outpatient Medications on File Prior to Visit  Medication Sig Dispense Refill   buPROPion (WELLBUTRIN XL) 150 MG 24 hr tablet Take 1 tablet (150 mg total) by mouth daily. 90 tablet 3   escitalopram (LEXAPRO) 20 MG tablet Take 1 tablet (20 mg total) by mouth daily. 90 tablet 3   levothyroxine (SYNTHROID) 112 MCG tablet Take 1 tablet (112 mcg total) by mouth daily. 90 tablet 1   No current facility-administered medications on file prior to visit.     ROS see history of present illness  Objective  Physical Exam There were no vitals filed for this visit.  BP Readings from Last 3 Encounters:  07/14/22 118/68  06/03/21 124/66  09/22/20 104/72   Wt Readings from Last 3 Encounters:  07/14/22 167 lb 3.2 oz (75.8 kg)  06/03/21 155 lb 9.6 oz (70.6 kg)  09/22/20 147 lb (66.7 kg)    Physical Exam   Assessment/Plan: Please see individual problem list.  There are no diagnoses linked to this encounter.   Health Maintenance: ***  No follow-ups on file.   Bethanie Dicker, NP-C [facility] [facility]

## 2022-10-04 ENCOUNTER — Encounter: Payer: 59 | Admitting: Nurse Practitioner

## 2023-01-11 ENCOUNTER — Other Ambulatory Visit: Payer: Self-pay | Admitting: Nurse Practitioner

## 2023-01-11 DIAGNOSIS — E039 Hypothyroidism, unspecified: Secondary | ICD-10-CM

## 2023-08-09 ENCOUNTER — Other Ambulatory Visit: Payer: Self-pay | Admitting: Nurse Practitioner

## 2023-08-09 DIAGNOSIS — E039 Hypothyroidism, unspecified: Secondary | ICD-10-CM

## 2023-08-09 DIAGNOSIS — F419 Anxiety disorder, unspecified: Secondary | ICD-10-CM

## 2023-08-22 ENCOUNTER — Encounter: Payer: Self-pay | Admitting: Nurse Practitioner

## 2023-08-22 ENCOUNTER — Ambulatory Visit: Admitting: Nurse Practitioner

## 2023-08-22 VITALS — BP 120/84 | HR 68 | Temp 98.6°F | Ht 62.0 in | Wt 167.6 lb

## 2023-08-22 DIAGNOSIS — E039 Hypothyroidism, unspecified: Secondary | ICD-10-CM | POA: Diagnosis not present

## 2023-08-22 DIAGNOSIS — E559 Vitamin D deficiency, unspecified: Secondary | ICD-10-CM

## 2023-08-22 DIAGNOSIS — Z13220 Encounter for screening for lipoid disorders: Secondary | ICD-10-CM

## 2023-08-22 DIAGNOSIS — F419 Anxiety disorder, unspecified: Secondary | ICD-10-CM

## 2023-08-22 DIAGNOSIS — Z0000 Encounter for general adult medical examination without abnormal findings: Secondary | ICD-10-CM | POA: Diagnosis not present

## 2023-08-22 DIAGNOSIS — F32A Depression, unspecified: Secondary | ICD-10-CM

## 2023-08-22 LAB — CBC WITH DIFFERENTIAL/PLATELET
Basophils Absolute: 0 10*3/uL (ref 0.0–0.1)
Basophils Relative: 0.4 % (ref 0.0–3.0)
Eosinophils Absolute: 0.1 10*3/uL (ref 0.0–0.7)
Eosinophils Relative: 1.1 % (ref 0.0–5.0)
HCT: 40.2 % (ref 36.0–46.0)
Hemoglobin: 13.2 g/dL (ref 12.0–15.0)
Lymphocytes Relative: 25.1 % (ref 12.0–46.0)
Lymphs Abs: 1.8 10*3/uL (ref 0.7–4.0)
MCHC: 32.8 g/dL (ref 30.0–36.0)
MCV: 92.6 fl (ref 78.0–100.0)
Monocytes Absolute: 0.6 10*3/uL (ref 0.1–1.0)
Monocytes Relative: 7.6 % (ref 3.0–12.0)
Neutro Abs: 4.8 10*3/uL (ref 1.4–7.7)
Neutrophils Relative %: 65.8 % (ref 43.0–77.0)
Platelets: 285 10*3/uL (ref 150.0–400.0)
RBC: 4.35 Mil/uL (ref 3.87–5.11)
RDW: 13.4 % (ref 11.5–15.5)
WBC: 7.3 10*3/uL (ref 4.0–10.5)

## 2023-08-22 LAB — IBC + FERRITIN
Ferritin: 25.4 ng/mL (ref 10.0–291.0)
Iron: 49 ug/dL (ref 42–145)
Saturation Ratios: 16.1 % — ABNORMAL LOW (ref 20.0–50.0)
TIBC: 305.2 ug/dL (ref 250.0–450.0)
Transferrin: 218 mg/dL (ref 212.0–360.0)

## 2023-08-22 LAB — LIPID PANEL
Cholesterol: 174 mg/dL (ref 0–200)
HDL: 57.9 mg/dL (ref >39.00)
LDL Cholesterol: 100 mg/dL — ABNORMAL HIGH (ref 0–99)
NonHDL: 116.41
Total CHOL/HDL Ratio: 3
Triglycerides: 81 mg/dL (ref 0.0–149.0)
VLDL: 16.2 mg/dL (ref 0.0–40.0)

## 2023-08-22 LAB — TSH: TSH: 4.69 u[IU]/mL (ref 0.35–5.50)

## 2023-08-22 LAB — COMPREHENSIVE METABOLIC PANEL WITH GFR
ALT: 14 U/L (ref 0–35)
AST: 16 U/L (ref 0–37)
Albumin: 4.3 g/dL (ref 3.5–5.2)
Alkaline Phosphatase: 59 U/L (ref 39–117)
BUN: 8 mg/dL (ref 6–23)
CO2: 28 meq/L (ref 19–32)
Calcium: 9.2 mg/dL (ref 8.4–10.5)
Chloride: 105 meq/L (ref 96–112)
Creatinine, Ser: 0.66 mg/dL (ref 0.40–1.20)
GFR: 120.22 mL/min (ref >60.00)
Glucose, Bld: 80 mg/dL (ref 70–99)
Potassium: 4.1 meq/L (ref 3.5–5.1)
Sodium: 139 meq/L (ref 135–145)
Total Bilirubin: 0.4 mg/dL (ref 0.2–1.2)
Total Protein: 6.4 g/dL (ref 6.0–8.3)

## 2023-08-22 LAB — VITAMIN D 25 HYDROXY (VIT D DEFICIENCY, FRACTURES): VITD: 31.85 ng/mL (ref 30.00–100.00)

## 2023-08-22 MED ORDER — BUPROPION HCL ER (XL) 300 MG PO TB24: 300.0000 mg | ORAL_TABLET | Freq: Every day | ORAL | 0 refills | Status: AC

## 2023-08-22 NOTE — Progress Notes (Signed)
 Bluford Burkitt, NP-C Phone: [phone]  Patricia Ruiz is a 28 y.o. adult who presents today for annual exam.   Discussed the use of AI scribe software for clinical note transcription with the patient, who gave verbal consent to proceed.  History of Present Illness   Patricia KINNIE "Cat" is a 30 year old with hypothyroidism and mood disorder who presents for an annual physical exam and thyroid  level check.  Their thyroid  levels were low last year, leading to an adjustment in their levothyroxine  dosage. They have noticed improvement but still experience muscle twitches and difficulty adjusting to temperature changes. No skin, nail, or hair changes, and no heart palpitations. Their TSH level was previously 0.15.  They have been experiencing a rough year emotionally, partly due to their aunt's recent diagnosis of Alzheimer's disease. They are currently taking Wellbutrin  XL 150 mg and Lexapro  20 mg. They describe their mood as manageable, using coping skills to navigate difficult times, but acknowledge that their anxiety has been more prominent, especially when deviating from their routine. They want to increase their Wellbutrin  dosage to help manage their symptoms.  They quit smoking in February and are currently using Nicorette gum. They report having regular menstrual cycles, though they have become shorter and heavier. They are not on birth control and are interested in a multivitamin that includes vitamin D  and iron due to concerns about low iron levels from heavy periods.  No chest pain, abdominal pain, constipation, diarrhea, headaches, dizziness, trouble swallowing, coughing, or skin changes. They report some difficulty falling asleep due to restlessness but have found that establishing a night routine, including reading, has been beneficial for their sleep.      Social History   Tobacco Use  Smoking Status Every Day   Types: Cigarettes  Smokeless Tobacco Never  Tobacco  Comments   1 pk lasts weeks as of 03/09/17     Current Outpatient Medications on File Prior to Visit  Medication Sig Dispense Refill   escitalopram  (LEXAPRO ) 20 MG tablet TAKE 1 TABLET BY MOUTH EVERY DAY 30 tablet 11   levothyroxine  (SYNTHROID ) 112 MCG tablet TAKE 1 TABLET BY MOUTH EVERY DAY 30 tablet 5   No current facility-administered medications on file prior to visit.    ROS see history of present illness  Objective  Physical Exam Vitals:   08/22/23 1003  BP: 120/84  Pulse: 68  Temp: 98.6 F (37 C)  SpO2: 99%    BP Readings from Last 3 Encounters:  08/22/23 120/84  07/14/22 118/68  06/03/21 124/66   Wt Readings from Last 3 Encounters:  08/22/23 167 lb 9.6 oz (76 kg)  07/14/22 167 lb 3.2 oz (75.8 kg)  06/03/21 155 lb 9.6 oz (70.6 kg)    Physical Exam Constitutional:      General: Cat is not in acute distress.    Appearance: Normal appearance.  HENT:     Head: Normocephalic.     Right Ear: Tympanic membrane normal.     Left Ear: Tympanic membrane normal.     Nose: Nose normal.     Mouth/Throat:     Mouth: Mucous membranes are moist.     Pharynx: Oropharynx is clear.  Eyes:     Conjunctiva/sclera: Conjunctivae normal.     Pupils: Pupils are equal, round, and reactive to light.  Neck:     Thyroid : No thyromegaly.  Cardiovascular:     Rate and Rhythm: Normal rate and regular rhythm.     Heart sounds: Normal  heart sounds.  Pulmonary:     Effort: Pulmonary effort is normal.     Breath sounds: Normal breath sounds.  Abdominal:     General: Abdomen is flat. Bowel sounds are normal.     Palpations: Abdomen is soft. There is no mass.     Tenderness: There is no abdominal tenderness.  Musculoskeletal:        General: Normal range of motion.  Lymphadenopathy:     Cervical: No cervical adenopathy.  Skin:    General: Skin is warm and dry.     Findings: No rash.  Neurological:     General: No focal deficit present.     Mental Status: Cat is alert.   Psychiatric:        Mood and Affect: Mood normal.        Behavior: Behavior normal.      Assessment/Plan: Please see individual problem list  Preventative health care Assessment & Plan: Physical exam complete. We will order routine lab work as outlined and contact patient with results. Pap smear is due at the end of May, they will return to complete this. Flu vaccine not in season and declines additional COVID vaccines. Tetanus vaccine is up to date. Regular menstrual cycles but shorter and heavier, possibly stress-related. Check iron levels due to heavy periods and recommend a multivitamin with iron and vitamin D . Stopped smoking in February 2025. Using Nicorette gum. Encourage continued abstinence and continue Nicorette gum as needed. Encourage routine dental and eye exams. Encourage healthy diet and regular exercise. Return to care in 2 months.   Orders: -     CBC with Differential/Platelet -     Comprehensive metabolic panel with GFR -     IBC + Ferritin  Hypothyroidism, unspecified type Assessment & Plan: Previous low thyroid  levels with symptoms of temperature sensitivity and muscle twitches. TSH was 0.15, with a target of 1-3. Currently managed with Levothyroxine  112 mcg daily. Continue. Levels may fluctuate due to stress and illness. Order thyroid  function tests.   Orders: -     TSH  Anxiety and depression Assessment & Plan: Managed with Wellbutrin  XL 150 mg and Lexapro  20 mg daily. Symptoms are manageable but worsened by family stressors. Increased anxiety with routine changes. Increase Wellbutrin  XL to 300 mg daily to improve mood. Counseled patient on common side effects. Encouraged to contact if worsening symptoms, unusual behavior changes or suicidal thoughts occur. PHQ- 12 and GAD- 16 today. Denies SI/HI. They will continue to see their therapist regularly. Follow up in 2 months to assess response.   Orders: -     buPROPion  HCl ER (XL); Take 1 tablet (300 mg total) by  mouth daily.  Dispense: 90 tablet; Refill: 0  Vitamin D  deficiency -     VITAMIN D  25 Hydroxy (Vit-D Deficiency, Fractures)  Lipid screening -     Lipid panel     Return in about 2 months (around 10/22/2023) for Follow up- pap, mood, and thyroid .   Bluford Burkitt, NP-C [facility] [employer] [facility]

## 2023-09-04 ENCOUNTER — Encounter: Payer: Self-pay | Admitting: Nurse Practitioner

## 2023-09-04 NOTE — Assessment & Plan Note (Addendum)
 Managed with Wellbutrin  XL 150 mg and Lexapro  20 mg daily. Symptoms are manageable but worsened by family stressors. Increased anxiety with routine changes. Increase Wellbutrin  XL to 300 mg daily to improve mood. Counseled patient on common side effects. Encouraged to contact if worsening symptoms, unusual behavior changes or suicidal thoughts occur. PHQ- 12 and GAD- 16 today. Denies SI/HI. They will continue to see their therapist regularly. Follow up in 2 months to assess response.

## 2023-09-04 NOTE — Assessment & Plan Note (Addendum)
 Previous low thyroid  levels with symptoms of temperature sensitivity and muscle twitches. TSH was 0.15, with a target of 1-3. Currently managed with Levothyroxine  112 mcg daily. Continue. Levels may fluctuate due to stress and illness. Order thyroid  function tests.

## 2023-09-04 NOTE — Assessment & Plan Note (Signed)
 Physical exam complete. We will order routine lab work as outlined and contact patient with results. Pap smear is due at the end of May, they will return to complete this. Flu vaccine not in season and declines additional COVID vaccines. Tetanus vaccine is up to date. Regular menstrual cycles but shorter and heavier, possibly stress-related. Check iron levels due to heavy periods and recommend a multivitamin with iron and vitamin D . Stopped smoking in February 2025. Using Nicorette gum. Encourage continued abstinence and continue Nicorette gum as needed. Encourage routine dental and eye exams. Encourage healthy diet and regular exercise. Return to care in 2 months.

## 2023-10-01 ENCOUNTER — Other Ambulatory Visit: Payer: Self-pay | Admitting: Nurse Practitioner

## 2023-10-01 DIAGNOSIS — F32A Depression, unspecified: Secondary | ICD-10-CM

## 2023-10-01 DIAGNOSIS — E039 Hypothyroidism, unspecified: Secondary | ICD-10-CM

## 2023-11-02 ENCOUNTER — Ambulatory Visit: Admitting: Nurse Practitioner

## 2023-12-12 ENCOUNTER — Ambulatory Visit: Admitting: Nurse Practitioner

## 2024-01-17 ENCOUNTER — Other Ambulatory Visit: Payer: Self-pay

## 2024-01-17 ENCOUNTER — Other Ambulatory Visit (HOSPITAL_COMMUNITY)
Admission: RE | Admit: 2024-01-17 | Discharge: 2024-01-17 | Disposition: A | Discharge status: Home / Self Care | Source: Ambulatory Visit | Attending: Nurse Practitioner | Admitting: Nurse Practitioner

## 2024-01-17 ENCOUNTER — Ambulatory Visit: Payer: Self-pay | Admitting: Nurse Practitioner

## 2024-01-17 ENCOUNTER — Encounter: Payer: Self-pay | Admitting: Nurse Practitioner

## 2024-01-17 ENCOUNTER — Ambulatory Visit: Admitting: Nurse Practitioner

## 2024-01-17 VITALS — BP 108/78 | HR 75 | Temp 98.5°F | Ht 62.0 in | Wt 159.6 lb

## 2024-01-17 DIAGNOSIS — Z124 Encounter for screening for malignant neoplasm of cervix: Secondary | ICD-10-CM | POA: Insufficient documentation

## 2024-01-17 DIAGNOSIS — E039 Hypothyroidism, unspecified: Secondary | ICD-10-CM

## 2024-01-17 DIAGNOSIS — F419 Anxiety disorder, unspecified: Secondary | ICD-10-CM

## 2024-01-17 DIAGNOSIS — Z01419 Encounter for gynecological examination (general) (routine) without abnormal findings: Secondary | ICD-10-CM | POA: Diagnosis not present

## 2024-01-17 DIAGNOSIS — R899 Unspecified abnormal finding in specimens from other organs, systems and tissues: Secondary | ICD-10-CM

## 2024-01-17 DIAGNOSIS — F32A Depression, unspecified: Secondary | ICD-10-CM

## 2024-01-17 DIAGNOSIS — Z23 Encounter for immunization: Secondary | ICD-10-CM

## 2024-01-17 LAB — TSH: TSH: 45.94 u[IU]/mL — ABNORMAL HIGH (ref 0.35–5.50)

## 2024-01-17 MED ORDER — LEVOTHYROXINE SODIUM 150 MCG PO TABS: 150.0000 ug | ORAL_TABLET | Freq: Every day | ORAL | 1 refills | Status: DC

## 2024-01-17 NOTE — Progress Notes (Signed)
 Leron Glance, NP-C Phone: [phone]  Patricia Ruiz is a 28 y.o. adult who presents today for well woman exam.    Discussed the use of AI scribe software for clinical note transcription with the patient, who gave verbal consent to proceed.  History of Present Illness   Patricia Ruiz is a 28 year old who presents for a well-woman exam and follow-up on mood and thyroid  management.  They are managing mood and anxiety issues and have contacted a psychiatrist for further evaluation within the next three months. They previously increased their Wellbutrin  dosage, which resulted in increased anxiety, so they reverted to their original dosage of 150 mg daily. They are also taking Lexapro  20 mg daily.  They are taking levothyroxine  daily for thyroid  management. 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 No temperature sensitivity or heart palpitations. Their thyroid  levels were previously low but were on the higher end of normal in April.  Their menstrual periods last about three days, with the last period ending on Monday, January 15, 2024. They report more intense cramps but no heavy bleeding, and their cycles are regular. They are not on birth control and have no history of abnormal Pap smears or breast concerns.  They perform self-breast exams and occasionally feel concerned about a specific area, but have not identified any lumps. No family history of breast issues.      Social History   Tobacco Use  Smoking Status Every Day   Types: Cigarettes  Smokeless Tobacco Never  Tobacco Comments   1 pk lasts weeks as of 03/09/17     Current Outpatient Medications on File Prior to Visit  Medication Sig Dispense Refill   buPROPion  (WELLBUTRIN  XL) 300 MG 24 hr tablet Take 1 tablet (300 mg total) by mouth daily. 90 tablet 0   escitalopram  (LEXAPRO ) 20 MG tablet TAKE 1 TABLET BY MOUTH EVERY DAY 90 tablet 3   No  current facility-administered medications on file prior to visit.     ROS see history of present illness  Objective  Physical Exam Vitals:   01/17/24 0941  BP: 108/78  Pulse: 75  Temp: 98.5 F (36.9 C)  SpO2: 98%    BP Readings from Last 3 Encounters:  01/17/24 108/78  08/22/23 120/84  07/14/22 118/68   Wt Readings from Last 3 Encounters:  01/17/24 159 lb 9.6 oz (72.4 kg)  08/22/23 167 lb 9.6 oz (76 kg)  07/14/22 167 lb 3.2 oz (75.8 kg)    Physical Exam Exam conducted with a chaperone present Claryce Ellen, CMA).  Constitutional:      General: Ruiz is not in acute distress.    Appearance: Normal appearance.  HENT:     Head: Normocephalic.     Right Ear: Tympanic membrane normal.     Left Ear: Tympanic membrane normal.     Nose: Nose normal.     Mouth/Throat:     Mouth: Mucous membranes are moist.     Pharynx: Oropharynx is clear.  Eyes:     Conjunctiva/sclera: Conjunctivae normal.     Pupils: Pupils are equal, round, and reactive to light.  Neck:     Thyroid : No thyromegaly.  Cardiovascular:     Rate and Rhythm: Normal rate and regular rhythm.     Heart sounds: Normal heart sounds.  Pulmonary:     Effort: Pulmonary effort is normal.     Breath sounds: Normal breath sounds.  Chest:  Breasts:    Right: Normal. No mass or tenderness.     Left: Normal. No mass or tenderness.  Abdominal:     General: Abdomen is flat. Bowel sounds are normal.     Palpations: Abdomen is soft. There is no mass.     Tenderness: There is no abdominal tenderness.  Genitourinary:    General: Normal vulva.     Pubic Area: No rash.      Labia:        Right: No rash or lesion.        Left: No rash or lesion.      Vagina: Normal. No vaginal discharge, tenderness or bleeding.     Cervix: Normal. No lesion, erythema or cervical bleeding.     Uterus: Normal.   Musculoskeletal:        General: Normal range of motion.  Lymphadenopathy:     Cervical: No cervical adenopathy.   Skin:    General: Skin is warm and dry.     Findings: No rash.  Neurological:     General: No focal deficit present.     Mental Status: Ruiz is alert.  Psychiatric:        Mood and Affect: Mood normal.        Behavior: Behavior normal.      Assessment/Plan: Please see individual problem list.  Well woman exam with routine gynecological exam Assessment & Plan: Routine visit with regular menstrual periods and no significant concerns. Perform Pap smear. We will contact patient with results. Flu vaccine administered today. Return to care in one year, sooner as needed.    Hypothyroidism, unspecified type Assessment & Plan: Managed with levothyroxine  150 mcg daily. Thyroid  levels improved to higher end of normal. Continue levothyroxine  as prescribed. Check TSH.  Orders: -     TSH  Anxiety and depression Assessment & Plan: Managed with Lexapro  and Wellbutrin . Previous dosage increase of Wellbutrin  led to increased anxiety, so dosage was reverted. Establishing care with psychiatrist for medication adjustments. Continue Lexapro  20 mg daily and Wellbutrin  150 mg daily. Establish care with psychiatrist. Encouraged to contact if worsening symptoms, unusual behavior changes or suicidal thoughts occur.    Screening for cervical cancer -     Cytology - PAP  Need for influenza vaccination -     Flu vaccine trivalent PF, 6mos and older(Flulaval,Afluria,Fluarix,Fluzone)     Return in about 1 year (around 01/16/2025) for Annual Exam, sooner as needed.   Leron Glance, NP-C [facility]

## 2024-01-22 LAB — CYTOLOGY - PAP
Adequacy: ABSENT
Chlamydia: NEGATIVE
Comment: NEGATIVE
Comment: NEGATIVE
Comment: NEGATIVE
Comment: NORMAL
Diagnosis: REACTIVE
High risk HPV: NEGATIVE
Neisseria Gonorrhea: NEGATIVE
Trichomonas: NEGATIVE

## 2024-02-06 ENCOUNTER — Encounter: Payer: Self-pay | Admitting: Nurse Practitioner

## 2024-02-06 NOTE — Assessment & Plan Note (Signed)
 Routine visit with regular menstrual periods and no significant concerns. Perform Pap smear. We will contact patient with results. Flu vaccine administered today. Return to care in one year, sooner as needed.

## 2024-02-06 NOTE — Assessment & Plan Note (Signed)
 Managed with Lexapro  and Wellbutrin . Previous dosage increase of Wellbutrin  led to increased anxiety, so dosage was reverted. Establishing care with psychiatrist for medication adjustments. Continue Lexapro  20 mg daily and Wellbutrin  150 mg daily. Establish care with psychiatrist. Encouraged to contact if worsening symptoms, unusual behavior changes or suicidal thoughts occur.

## 2024-02-06 NOTE — Assessment & Plan Note (Signed)
 Managed with levothyroxine  150 mcg daily. Thyroid  levels improved to higher end of normal. Continue levothyroxine  as prescribed. Check TSH.

## 2024-02-11 ENCOUNTER — Other Ambulatory Visit: Payer: Self-pay | Admitting: Nurse Practitioner

## 2024-02-11 DIAGNOSIS — E039 Hypothyroidism, unspecified: Secondary | ICD-10-CM

## 2024-02-23 ENCOUNTER — Other Ambulatory Visit: Payer: Self-pay | Admitting: Nurse Practitioner

## 2024-02-23 DIAGNOSIS — R87618 Other abnormal cytological findings on specimens from cervix uteri: Secondary | ICD-10-CM

## 2024-02-27 ENCOUNTER — Other Ambulatory Visit
